# Patient Record
Sex: Female | Born: 1975
Health system: Southern US, Community
[De-identification: ages and names within clinical notes are randomized; demographics above are authoritative.]

## PROBLEM LIST (undated history)

## (undated) DIAGNOSIS — K219 Gastro-esophageal reflux disease without esophagitis: Secondary | ICD-10-CM

## (undated) DIAGNOSIS — Z9889 Other specified postprocedural states: Secondary | ICD-10-CM

## (undated) DIAGNOSIS — I10 Essential (primary) hypertension: Secondary | ICD-10-CM

## (undated) DIAGNOSIS — IMO0002 Reserved for concepts with insufficient information to code with codable children: Secondary | ICD-10-CM

## (undated) DIAGNOSIS — M1990 Unspecified osteoarthritis, unspecified site: Secondary | ICD-10-CM

## (undated) DIAGNOSIS — R87619 Unspecified abnormal cytological findings in specimens from cervix uteri: Secondary | ICD-10-CM

## (undated) HISTORY — PX: BREAST SURGERY: SHX581

## (undated) HISTORY — PX: DILATION AND CURETTAGE OF UTERUS: SHX78

## (undated) HISTORY — DX: Unspecified abnormal cytological findings in specimens from cervix uteri: R87.619

## (undated) HISTORY — DX: Reserved for concepts with insufficient information to code with codable children: IMO0002

---

## 1999-01-05 ENCOUNTER — Ambulatory Visit (HOSPITAL_COMMUNITY): Admission: RE | Admit: 1999-01-05 | Discharge: 1999-01-05 | Payer: Self-pay | Admitting: Obstetrics

## 1999-05-23 ENCOUNTER — Inpatient Hospital Stay (HOSPITAL_COMMUNITY): Admission: AD | Admit: 1999-05-23 | Discharge: 1999-05-23 | Payer: Self-pay | Admitting: *Deleted

## 1999-05-25 ENCOUNTER — Inpatient Hospital Stay (HOSPITAL_COMMUNITY): Admission: AD | Admit: 1999-05-25 | Discharge: 1999-05-25 | Payer: Self-pay | Admitting: Obstetrics & Gynecology

## 1999-05-29 ENCOUNTER — Inpatient Hospital Stay (HOSPITAL_COMMUNITY): Admission: AD | Admit: 1999-05-29 | Discharge: 1999-05-31 | Payer: Self-pay | Admitting: Obstetrics & Gynecology

## 2008-01-22 ENCOUNTER — Ambulatory Visit (HOSPITAL_COMMUNITY): Admission: RE | Admit: 2008-01-22 | Discharge: 2008-01-22 | Payer: Self-pay | Admitting: Obstetrics

## 2008-06-01 ENCOUNTER — Inpatient Hospital Stay (HOSPITAL_COMMUNITY): Admission: AD | Admit: 2008-06-01 | Discharge: 2008-06-03 | Payer: Self-pay | Admitting: Obstetrics

## 2010-09-17 NOTE — L&D Delivery Note (Signed)
Delivery Note At 5:20 AM a viable female was delivered via Vaginal, Spontaneous Delivery (Presentation: Right Occiput Anterior).  APGAR: 9, 8; weight 6+12 Placenta status: Intact, Spontaneous.  Cord: 3 vessels with the following complications: None.   Anesthesia: None  Episiotomy: None Lacerations: none Est. Blood Loss (mL): 250cc  Mom to postpartum.  Baby to nursery-stable.  Cam Hai 08/12/2011, 5:35 AM

## 2011-05-19 ENCOUNTER — Encounter (HOSPITAL_COMMUNITY): Payer: Self-pay

## 2011-05-19 ENCOUNTER — Inpatient Hospital Stay (HOSPITAL_COMMUNITY)
Admission: AD | Admit: 2011-05-19 | Discharge: 2011-05-20 | Disposition: A | Discharge status: Home / Self Care | Payer: BC Managed Care – PPO | Source: Ambulatory Visit | Attending: Obstetrics & Gynecology | Admitting: Obstetrics & Gynecology

## 2011-05-19 DIAGNOSIS — R1013 Epigastric pain: Secondary | ICD-10-CM | POA: Insufficient documentation

## 2011-05-19 DIAGNOSIS — O0930 Supervision of pregnancy with insufficient antenatal care, unspecified trimester: Secondary | ICD-10-CM

## 2011-05-19 DIAGNOSIS — O99891 Other specified diseases and conditions complicating pregnancy: Secondary | ICD-10-CM | POA: Insufficient documentation

## 2011-05-19 DIAGNOSIS — Z0371 Encounter for suspected problem with amniotic cavity and membrane ruled out: Secondary | ICD-10-CM | POA: Insufficient documentation

## 2011-05-19 LAB — URINALYSIS, ROUTINE W REFLEX MICROSCOPIC
Bilirubin Urine: NEGATIVE
Hgb urine dipstick: NEGATIVE
Specific Gravity, Urine: >1.03 — ABNORMAL HIGH (ref 1.005–1.030)
pH: 6 (ref 5.0–8.0)

## 2011-05-19 NOTE — Progress Notes (Signed)
Pt states, " Yesterday after my shower, I noticed a little water ran out. No more can out, but today I had some cramping in my abdomen and it made we concerned."

## 2011-05-19 NOTE — Progress Notes (Signed)
Patient is here with c/o upper abdominal cramping today. She states that she had an episode of clear vaginal discharge yesterday and none today. She states that she does not have prenatal care because no one wants to take care of her due not having [place] ID card. She states that she had adopt a mom in 2009 and was delivered by dr Gaynell Face. She reports good fetal movement and denies vaginal bleeding.

## 2011-05-20 ENCOUNTER — Inpatient Hospital Stay (HOSPITAL_COMMUNITY): Payer: BC Managed Care – PPO

## 2011-05-20 NOTE — ED Provider Notes (Signed)
Joyce Garcia is a 36 y.o. female at 43.[redacted] week gestation presenting for  questionable LOF x 1 yesterday after getting out of the shower, w/ no leaking to follow and wanting to "check on the baby" since she has not started [facility] due to not having ID. She also reports mild epigastric pain over the past day. She denies contractions, vaginal bleeding, N/V/D, fever or chills.  Maternal Medical History:  Reason for admission: Reason for admission: rupture of membranes.  Contractions: None   Fetal activity: Perceived fetal activity is normal.      OB History    Grav Para Term Preterm Abortions TAB SAB Ect Mult Living   7 3 3  3  3   3     Denies HX pregnancy complications History reviewed. No pertinent past medical history. Past Surgical History  Procedure Date  . Dilation and curettage of uterus     miscarriage  Hx abd Pap and colpo  Family History: family history is not on file. Social History:  reports that she has never smoked. She does not have any smokeless tobacco history on file. She reports that she does not drink alcohol or use illicit drugs.  Review of Systems  All other systems reviewed and are negative.      Blood pressure 118/78, pulse 82, temperature 98.5 F (36.9 C), temperature source Oral, resp. rate 18, height 5\' 2"  (1.575 m), weight 70.931 kg (156 lb 6 oz), last menstrual period 11/06/2010, SpO2 97.00%. Maternal Exam:  Uterine Assessment: None  Abdomen: Fundal height is S=D.    Introitus: Normal vulva. Vagina is negative for discharge.  Neg pooling  Cervix: Cervix evaluated by sterile speculum exam and digital exam.     Fetal Exam Fetal Monitor Review: Mode: ultrasound.   Baseline rate: 140-150's.  Variability: moderate (6-25 bpm).   Pattern: accelerations present and no decelerations.    Fetal State Assessment: Category I - tracings are normal.     Physical Exam  Constitutional: She is oriented to person, place, and time. She appears  well-developed and well-nourished. No distress.  HENT:  Head: Normocephalic.  Cardiovascular: Normal rate.   Respiratory: Effort normal.  GI: Soft. There is no tenderness.  Genitourinary: Vagina normal and uterus normal. There is no lesion on the right labia. There is no lesion on the left labia. Uterus is not tender. Cervix exhibits no discharge and no friability. No erythema or bleeding around the vagina. No vaginal discharge found.  Musculoskeletal: Normal range of motion.  Neurological: She is alert and oriented to person, place, and time.  Skin: Skin is warm and dry.  Psychiatric: She has a normal mood and affect.    Cervix: long, closed, -3 , posterior, mod consistency, no LOF  Results for orders placed during the hospital encounter of 05/19/11 (from the past 24 hour(s))  URINALYSIS, ROUTINE W REFLEX MICROSCOPIC     Status: Abnormal   Collection Time   05/19/11 10:00 PM      Component Value Range   Color, Urine YELLOW  YELLOW    Appearance CLEAR  CLEAR    Specific Gravity, Urine >1.030 (*) 1.005 - 1.030    pH 6.0  5.0 - 8.0    Glucose, UA NEGATIVE  NEGATIVE (mg/dL)   Hgb urine dipstick NEGATIVE  NEGATIVE    Bilirubin Urine NEGATIVE  NEGATIVE    Ketones, ur 15 (*) NEGATIVE (mg/dL)   Protein, ur NEGATIVE  NEGATIVE (mg/dL)   Urobilinogen, UA 1.0  0.0 - 1.0 (  mg/dL)   Nitrite NEGATIVE  NEGATIVE    Leukocytes, UA NEGATIVE  NEGATIVE    [place]: SIUP, S=D. Vtx, ant placenta above os. Normal anatomy, except spine not well-visualized. Normal fluid.  Prenatal labs: ABO, Rh:   Antibody:   Rubella:   RPR:    HBsAg:    HIV:    GBS:     Assessment/Plan: Assessment: 1. 27.5 Week IUP 2. No evidence of leaking of fluid 3. No PNC  Plan: 1. Per consult w/ Dr. Macon Large and Nolene Bernheim, GDHD NP, pt will be accepted for [facility]. If unable to get appt by 32-34 weeks, we will accept her at the [facility].  2. PTL precautions 3. F/U in [facility] PRN    SMITH, [place] 05/20/2011, 12:09 AM

## 2011-05-21 NOTE — ED Provider Notes (Signed)
Attestation of Attending Supervision of Advanced Practitioner: Evaluation and management procedures were performed by the PA/NP/CNM/OB Fellow under my supervision/collaboration. Chart reviewed and agree with management and plan.  Marialice Newkirk A 05/21/2011 3:24 PM

## 2011-05-22 LAB — GC/CHLAMYDIA PROBE AMP, URINE: Chlamydia, Swab/Urine, PCR: NEGATIVE

## 2011-06-18 LAB — CBC
HCT: 39.8
HCT: 44
Hemoglobin: 13.5
Hemoglobin: 14.9
MCHC: 33.9
MCV: 102 — ABNORMAL HIGH
RBC: 3.91
WBC: 7.7

## 2011-06-20 ENCOUNTER — Ambulatory Visit (INDEPENDENT_AMBULATORY_CARE_PROVIDER_SITE_OTHER): Payer: Self-pay | Admitting: Advanced Practice Midwife

## 2011-06-20 VITALS — BP 101/61 | Temp 97.8°F | Wt 159.1 lb

## 2011-06-20 DIAGNOSIS — Z3490 Encounter for supervision of normal pregnancy, unspecified, unspecified trimester: Secondary | ICD-10-CM

## 2011-06-20 DIAGNOSIS — Z3480 Encounter for supervision of other normal pregnancy, unspecified trimester: Secondary | ICD-10-CM

## 2011-06-20 LAB — HIV ANTIBODY (ROUTINE TESTING W REFLEX): HIV: NONREACTIVE

## 2011-06-20 LAB — POCT URINALYSIS DIP (DEVICE)
Hgb urine dipstick: NEGATIVE
Nitrite: NEGATIVE
Urobilinogen, UA: 0.2 mg/dL (ref 0.0–1.0)
pH: 7 (ref 5.0–8.0)

## 2011-06-20 NOTE — Progress Notes (Signed)
I supervised pt's care and agree with student's note.  

## 2011-06-20 NOTE — Progress Notes (Signed)
Addended by: Archie Patten on: 06/20/2011 10:02 AM   Modules accepted: Orders

## 2011-06-20 NOTE — Progress Notes (Signed)
Addended by: Joseph Berkshire on: 06/20/2011 10:10 AM   Modules accepted: Kipp Brood

## 2011-06-20 NOTE — Patient Instructions (Signed)
Pregnancy - Third Trimester The third trimester of pregnancy (the last 3 months) is a period of the most rapid growth for you and your baby. The baby approaches a length of 20 inches and a weight of 6 to 10 pounds. The baby is adding on fat and getting ready for life outside your body. While inside, babies have periods of sleeping and waking, suck their thumbs, and hiccups. You can often feel small contractions of the uterus. This is false labor. It is also called Braxton-Hicks contractions. This is like a practice for labor. The usual problems in this stage of pregnancy include more difficulty breathing, swelling of the hands and feet from water retention, and having to urinate more often because of the uterus and baby pressing on your bladder.  PRENATAL EXAMS  Blood work may continue to be done during prenatal exams. These tests are done to check on your health and the probable health of your baby. Blood work is used to follow your blood levels (hemoglobin). Anemia (low hemoglobin) is common during pregnancy. Iron and vitamins are given to help prevent this. You may also continue to be checked for diabetes. Some of the past blood tests may be done again.   The size of the uterus is measured during each visit. This makes sure your baby is growing properly according to your pregnancy dates.   Your blood pressure is checked every prenatal visit. This is to make sure you are not getting toxemia.   Your urine is checked every prenatal visit for infection, diabetes and protein.   Your weight is checked at each visit. This is done to make sure gains are happening at the suggested rate and that you and your baby are growing normally.   Sometimes, an ultrasound is performed to confirm the position and the proper growth and development of the baby. This is a test done that bounces harmless sound waves off the baby so your caregiver can more accurately determine due dates.   Discuss the type of pain  medication and anesthesia you will have during your labor and delivery.   Discuss the possibility and anesthesia if a Cesarean Section might be necessary.   Inform your caregiver if there is any mental or physical violence at home.  Sometimes, a specialized non-stress test, contraction stress test and biophysical profile are done to make sure the baby is not having a problem. Checking the amniotic fluid surrounding the baby is called an amniocentesis. The amniotic fluid is removed by sticking a needle into the belly (abdomen). This is sometimes done near the end of pregnancy if an early delivery is required. In this case, it is done to help make sure the baby's lungs are mature enough for the baby to live outside of the womb. If the lungs are not mature and it is unsafe to deliver the baby, an injection of cortisone medication is given to the mother 1 to 2 days before the delivery. This helps the baby's lungs mature and makes it safer to deliver the baby. CHANGES OCCURING IN THE THIRD TRIMESTER OF PREGNANCY Your body goes through many changes during pregnancy. They vary from person to person. Talk to your caregiver about changes you notice and are concerned about.  During the last trimester, you have probably had an increase in your appetite. It is normal to have cravings for certain foods. This varies from person to person and pregnancy to pregnancy.   You may begin to get stretch marks on your hips,   abdomen, and breasts. These are normal changes in the body during pregnancy. There are no exercises or medications to take which prevent this change.   Constipation may be treated with a stool softener or adding bulk to your diet. Drinking lots of fluids, fiber in vegetables, fruits, and whole grains are helpful.   Exercising is also helpful. If you have been very active up until your pregnancy, most of these activities can be continued during your pregnancy. If you have been less active, it is helpful  to start an exercise program such as walking. Consult your caregiver before starting exercise programs.   Avoid all smoking, alcohol, un-prescribed drugs, herbs and "street drugs" during your pregnancy. These chemicals affect the formation and growth of the baby. Avoid chemicals throughout the pregnancy to ensure the delivery of a healthy infant.   Backache, varicose veins and hemorrhoids may develop or get worse.   You will tire more easily in the third trimester, which is normal.   The baby's movements may be stronger and more often.   You may become short of breath easily.   Your belly button may stick out.   A yellow discharge may leak from your breasts called colostrum.   You may have a bloody mucus discharge. This usually occurs a few days to a week before labor begins.  HOME CARE INSTRUCTIONS  Keep your caregiver's appointments. Follow your caregiver's instructions regarding medication use, exercise, and diet.   During pregnancy, you are providing food for you and your baby. Continue to eat regular, well-balanced meals. Choose foods such as meat, fish, milk and other low fat dairy products, vegetables, fruits, and whole-grain breads and cereals. Your caregiver will tell you of the ideal weight gain.   A physical sexual relationship may be continued throughout pregnancy if there are no other problems such as early (premature) leaking of amniotic fluid from the membranes, vaginal bleeding, or belly (abdominal) pain.   Exercise regularly if there are no restrictions. Check with your caregiver if you are unsure of the safety of your exercises. Greater weight gain will occur in the last 2 trimesters of pregnancy. Exercising helps:   Control your weight.   Get you in shape for labor and delivery.   You lose weight after you deliver.   Rest a lot with legs elevated, or as needed for leg cramps or low back pain.   Wear a good support or jogging bra for breast tenderness during  pregnancy. This may help if worn during sleep. Pads or tissues may be used in the bra if you are leaking colostrum.   Do not use hot tubs, steam rooms, or saunas.   Wear your seat belt when driving. This protects you and your baby if you are in an accident.   Avoid raw meat, cat litter boxes and soil used by cats. These carry germs that can cause birth defects in the baby.   It is easier to loose urine during pregnancy. Tightening up and strengthening the pelvic muscles will help with this problem. You can practice stopping your urination while you are going to the bathroom. These are the same muscles you need to strengthen. It is also the muscles you would use if you were trying to stop from passing gas. You can practice tightening these muscles up 10 times a set and repeating this about 3 times per day. Once you know what muscles to tighten up, do not perform these exercises during urination. It is more likely to   cause an infection by backing up the urine.   Ask for help if you have financial, counseling or nutritional needs during pregnancy. Your caregiver will be able to offer counseling for these needs as well as refer you for other special needs.   Make a list of emergency phone numbers and have them available.   Plan on getting help from family or friends when you go home from the hospital.   Make a trial run to the hospital.   Take prenatal classes with the father to understand, practice and ask questions about the labor and delivery.   Prepare the baby's room/nursery.   Do not travel out of the city unless it is absolutely necessary and with the advice of your caregiver.   Wear only low or no heal shoes to have better balance and prevent falling.  MEDICATIONS AND DRUG USE IN PREGNANCY  Take prenatal vitamins as directed. The vitamin should contain 1 milligram of folic acid. Keep all vitamins out of reach of children. Only a couple vitamins or tablets containing iron may be fatal  to a baby or young child when ingested.   Avoid use of all medications, including herbs, over-the-counter medications, not prescribed or suggested by your caregiver. Only take over-the-counter or prescription medicines for pain, discomfort, or fever as directed by your caregiver. Do not use aspirin, ibuprofen (Motrin, Advil, Nuprin) or naproxen (Aleve) unless OK'd by your caregiver.   Let your caregiver also know about herbs you may be using.   Alcohol is related to a number of birth defects. This includes fetal alcohol syndrome. All alcohol, in any form, should be avoided completely. Smoking will cause low birth rate and premature babies.   Street/illegal drugs are very harmful to the baby. They are absolutely forbidden. A baby born to an addicted mother will be addicted at birth. The baby will go through the same withdrawal an adult does.  SEEK MEDICAL CARE IF  You have any concerns or worries during your pregnancy. It is better to call with your questions if you feel they cannot wait, rather than worry about them.  DECISIONS ABOUT CIRCUMCISION You may or may not know the sex of your baby. If you know your baby is a boy, it may be time to think about circumcision. Circumcision is the removal of the foreskin of the penis. This is the skin that covers the sensitive end of the penis. There is no proven medical need for this. Often this decision is made on what is popular at the time or based upon religious beliefs and social issues. You can discuss these issues with your caregiver or pediatrician. SEEK IMMEDIATE MEDICAL CARE IF:  An unexplained oral temperature above 100.5 develops, or as your caregiver suggests.   You have leaking of fluid from the vagina (birth canal). If leaking membranes are suspected, take your temperature and tell your caregiver of this when you call.   There is vaginal spotting, bleeding or passing clots. Tell your caregiver of the amount and how many pads are used.    You develop a bad smelling vaginal discharge with a change in the color from clear to white.   You develop vomiting that lasts more than 24 hours.   You develop chills or fever.   You develop shortness of breath.   You develop burning on urination.   You loose more than 2 pounds of weight or gain more than 2 pounds of weight or as suggested by your caregiver.     You notice sudden swelling of your face, hands, and feet or legs.   You develop belly (abdominal) pain. Round ligament discomfort is a common non-cancerous (benign) cause of abdominal pain in pregnancy. Your caregiver still must evaluate you.   You develop a severe headache that does not go away.   You develop visual problems, blurred or double vision.   If you have not felt your baby move for more than 1 hour. If you think the baby is not moving as much as usual, eat something with sugar in it and lie down on your left side for an hour. The baby should move at least 4 to 5 times per hour. Call right away if your baby moves less than that.   You fall, are in a car accident or any kind of trauma.   There is mental or physical violence at home.  Document Released: 08/28/2001 Document Re-Released: 07/01/2009 Exit[employer] Patient Information 2011 [employer]. 

## 2011-06-20 NOTE — Progress Notes (Signed)
Some swelling in her feet, has pelvic pressure but no pain. 1 hr gtt today due at 1012, also needs all initial labs and hiv, hgb electro. Pt offered flu vaccine but declines, pt signed form declining flu vaccine.

## 2011-06-20 NOTE — Progress Notes (Signed)
   Subjective:    Joyce Garcia is a [id number] [redacted]w[redacted]d being seen today for her first obstetrical visit. Pregnancy history fully reviewed.  Patient reports backache, no bleeding, no contractions, no leaking and round ligament pain.  Filed Vitals:   06/20/11 0856  BP: 101/61  Temp: 97.8 F (36.6 C)  Weight: 72.167 kg (159 lb 1.6 oz)    HISTORY: OB History    Grav Para Term Preterm Abortions TAB SAB Ect Mult Living   7 3 3  3  3   3      # Outc Date GA Lbr Len/2nd Wgt Sex Del Anes PTL Lv   1 TRM 2/98    M SVD   Yes   2 TRM 9/00    M SVD   Yes   3 TRM 9/09    F SVD   Yes   4 SAB 2/11           5 SAB            6 SAB            7 CUR              Past Medical History  Diagnosis Date  . Abnormal Pap smear     at HD prior to becoming pregnant, unsure of what was abnormal   Past Surgical History  Procedure Date  . Dilation and curettage of uterus     miscarriage  . Breast surgery     had lump removed   Family History  Problem Relation Age of Onset  . Hypertension Father   . Hypertension Sister   . Hypertension Sister   . Hypertension Sister      Exam    Uterine Size: 31.5 cm  Pelvic Exam:    Perineum: Not assessed   Vulva: Not assessed   Vagina:  Exam deferred until next visit   pH:    Cervix: exam deferred   Adnexa: exam deferred   Bony Pelvis: exam deferred  System: Breast:  normal appearance, no masses or tenderness, not assessed   Skin: normal coloration and turgor, no rashes    Neurologic: oriented, normal, normal mood   Extremities: no musculoskeletal defects noted, joint mobility appears intact   HEENT PERRLA   Mouth/Teeth mucous membranes moist, pharynx normal without lesions   Neck supple   Cardiovascular: regular rate and rhythm, no murmurs or gallops   Respiratory:  appears well, vitals normal, no respiratory distress, acyanotic, normal RR, ear and throat exam is normal, neck free of mass or lymphadenopathy, chest clear, no wheezing,  crepitations, rhonchi, normal symmetric air entry   Abdomen: soft, non-tender; bowel sounds normal; no masses,  no organomegaly   Urinary: not assessed      Assessment:    Pregnancy: [id number] There is no problem list on file for this patient. Normal pregnancy; late prenatal care.       Plan:     Initial labs drawn. Prenatal vitamins. Problem list reviewed and updated. Ultrasound discussed; fetal survey: ordered. Patient to complete ROI for Pap smear results from [facility]. Pap smear at next visit if needed, depending on results and dates from previous pap smear.  Follow up in 2 weeks.    Judith Blonder 06/20/2011

## 2011-06-20 NOTE — Progress Notes (Signed)
Addended by: Archie Patten on: 06/20/2011 10:12 AM   Modules accepted: Orders

## 2011-06-21 LAB — OBSTETRIC PANEL
Antibody Screen: NEGATIVE
Basophils Relative: 0 % (ref 0–1)
HCT: 35.8 % — ABNORMAL LOW (ref 36.0–46.0)
Hemoglobin: 12 g/dL (ref 12.0–15.0)
Lymphocytes Relative: 23 % (ref 12–46)
Lymphs Abs: 1.9 10*3/uL (ref 0.7–4.0)
MCHC: 33.5 g/dL (ref 30.0–36.0)
Monocytes Absolute: 0.5 10*3/uL (ref 0.1–1.0)
Monocytes Relative: 7 % (ref 3–12)
Neutro Abs: 5.7 10*3/uL (ref 1.7–7.7)
Rh Type: POSITIVE
Rubella: 247.1 IU/mL — ABNORMAL HIGH

## 2011-06-21 LAB — DRUGS OF ABUSE SCREEN W/O ALC, ROUTINE URINE
Barbiturate Quant, Ur: NEGATIVE
Cocaine Metabolites: NEGATIVE
Creatinine,U: 104.5 mg/dL
Opiate Screen, Urine: NEGATIVE

## 2011-06-25 ENCOUNTER — Ambulatory Visit (HOSPITAL_COMMUNITY)
Admission: RE | Admit: 2011-06-25 | Discharge: 2011-06-25 | Disposition: A | Discharge status: Home / Self Care | Payer: Self-pay | Source: Ambulatory Visit | Attending: Advanced Practice Midwife | Admitting: Advanced Practice Midwife

## 2011-06-25 DIAGNOSIS — O2620 Pregnancy care for patient with recurrent pregnancy loss, unspecified trimester: Secondary | ICD-10-CM | POA: Insufficient documentation

## 2011-06-25 DIAGNOSIS — Z3490 Encounter for supervision of normal pregnancy, unspecified, unspecified trimester: Secondary | ICD-10-CM

## 2011-06-25 DIAGNOSIS — Z3689 Encounter for other specified antenatal screening: Secondary | ICD-10-CM | POA: Insufficient documentation

## 2011-07-04 ENCOUNTER — Ambulatory Visit (INDEPENDENT_AMBULATORY_CARE_PROVIDER_SITE_OTHER): Payer: Self-pay | Admitting: Obstetrics and Gynecology

## 2011-07-04 DIAGNOSIS — Z3480 Encounter for supervision of other normal pregnancy, unspecified trimester: Secondary | ICD-10-CM

## 2011-07-04 DIAGNOSIS — O09529 Supervision of elderly multigravida, unspecified trimester: Secondary | ICD-10-CM

## 2011-07-04 LAB — POCT URINALYSIS DIP (DEVICE)
Bilirubin Urine: NEGATIVE
Glucose, UA: NEGATIVE mg/dL
Hgb urine dipstick: NEGATIVE
Ketones, ur: NEGATIVE mg/dL
Specific Gravity, Urine: 1.02 (ref 1.005–1.030)

## 2011-07-04 NOTE — Progress Notes (Signed)
Edema- feet. Pain- lower back. No vaginal discharge. Pulse-94.  Pt declines flu vaccine

## 2011-07-04 NOTE — Progress Notes (Signed)
S: LBP esp with walking. Concerned about Pap because had "treatment" for abnl last year. Undecided about contraceptive method.  O: [place] 10/8 all nl. 65th%ile. All PN labs reviewed and nl. Per [facility] Pap nl on 09/2010, GC/CT neg. Back: neg CVAT. Sl TTP paraspinous area L-S spine Abd: soft, NT, large diastasis  A/P: M-S LBP due to multiparity and poor abd muscle tone. Reviewed contraceptive options and plan to breast and botttle feed. Reviewed all her lab results.

## 2011-07-04 NOTE — Patient Instructions (Signed)
Pregnancy - Third Trimester The third trimester of pregnancy (the last 3 months) is a period of the most rapid growth for you and your baby. The baby approaches a length of 20 inches and a weight of 6 to 10 pounds. The baby is adding on fat and getting ready for life outside your body. While inside, babies have periods of sleeping and waking, suck their thumbs, and hiccups. You can often feel small contractions of the uterus. This is false labor. It is also called Braxton-Hicks contractions. This is like a practice for labor. The usual problems in this stage of pregnancy include more difficulty breathing, swelling of the hands and feet from water retention, and having to urinate more often because of the uterus and baby pressing on your bladder.  PRENATAL EXAMS  Blood work may continue to be done during prenatal exams. These tests are done to check on your health and the probable health of your baby. Blood work is used to follow your blood levels (hemoglobin). Anemia (low hemoglobin) is common during pregnancy. Iron and vitamins are given to help prevent this. You may also continue to be checked for diabetes. Some of the past blood tests may be done again.   The size of the uterus is measured during each visit. This makes sure your baby is growing properly according to your pregnancy dates.   Your blood pressure is checked every prenatal visit. This is to make sure you are not getting toxemia.   Your urine is checked every prenatal visit for infection, diabetes and protein.   Your weight is checked at each visit. This is done to make sure gains are happening at the suggested rate and that you and your baby are growing normally.   Sometimes, an ultrasound is performed to confirm the position and the proper growth and development of the baby. This is a test done that bounces harmless sound waves off the baby so your caregiver can more accurately determine due dates.   Discuss the type of pain  medication and anesthesia you will have during your labor and delivery.   Discuss the possibility and anesthesia if a Cesarean Section might be necessary.   Inform your caregiver if there is any mental or physical violence at home.  Sometimes, a specialized non-stress test, contraction stress test and biophysical profile are done to make sure the baby is not having a problem. Checking the amniotic fluid surrounding the baby is called an amniocentesis. The amniotic fluid is removed by sticking a needle into the belly (abdomen). This is sometimes done near the end of pregnancy if an early delivery is required. In this case, it is done to help make sure the baby's lungs are mature enough for the baby to live outside of the womb. If the lungs are not mature and it is unsafe to deliver the baby, an injection of cortisone medication is given to the mother 1 to 2 days before the delivery. This helps the baby's lungs mature and makes it safer to deliver the baby. CHANGES OCCURING IN THE THIRD TRIMESTER OF PREGNANCY Your body goes through many changes during pregnancy. They vary from person to person. Talk to your caregiver about changes you notice and are concerned about.  During the last trimester, you have probably had an increase in your appetite. It is normal to have cravings for certain foods. This varies from person to person and pregnancy to pregnancy.   You may begin to get stretch marks on your hips,   abdomen, and breasts. These are normal changes in the body during pregnancy. There are no exercises or medications to take which prevent this change.   Constipation may be treated with a stool softener or adding bulk to your diet. Drinking lots of fluids, fiber in vegetables, fruits, and whole grains are helpful.   Exercising is also helpful. If you have been very active up until your pregnancy, most of these activities can be continued during your pregnancy. If you have been less active, it is helpful  to start an exercise program such as walking. Consult your caregiver before starting exercise programs.   Avoid all smoking, alcohol, un-prescribed drugs, herbs and "street drugs" during your pregnancy. These chemicals affect the formation and growth of the baby. Avoid chemicals throughout the pregnancy to ensure the delivery of a healthy infant.   Backache, varicose veins and hemorrhoids may develop or get worse.   You will tire more easily in the third trimester, which is normal.   The baby's movements may be stronger and more often.   You may become short of breath easily.   Your belly button may stick out.   A yellow discharge may leak from your breasts called colostrum.   You may have a bloody mucus discharge. This usually occurs a few days to a week before labor begins.  HOME CARE INSTRUCTIONS  Keep your caregiver's appointments. Follow your caregiver's instructions regarding medication use, exercise, and diet.   During pregnancy, you are providing food for you and your baby. Continue to eat regular, well-balanced meals. Choose foods such as meat, fish, milk and other low fat dairy products, vegetables, fruits, and whole-grain breads and cereals. Your caregiver will tell you of the ideal weight gain.   A physical sexual relationship may be continued throughout pregnancy if there are no other problems such as early (premature) leaking of amniotic fluid from the membranes, vaginal bleeding, or belly (abdominal) pain.   Exercise regularly if there are no restrictions. Check with your caregiver if you are unsure of the safety of your exercises. Greater weight gain will occur in the last 2 trimesters of pregnancy. Exercising helps:   Control your weight.   Get you in shape for labor and delivery.   You lose weight after you deliver.   Rest a lot with legs elevated, or as needed for leg cramps or low back pain.   Wear a good support or jogging bra for breast tenderness during  pregnancy. This may help if worn during sleep. Pads or tissues may be used in the bra if you are leaking colostrum.   Do not use hot tubs, steam rooms, or saunas.   Wear your seat belt when driving. This protects you and your baby if you are in an accident.   Avoid raw meat, cat litter boxes and soil used by cats. These carry germs that can cause birth defects in the baby.   It is easier to loose urine during pregnancy. Tightening up and strengthening the pelvic muscles will help with this problem. You can practice stopping your urination while you are going to the bathroom. These are the same muscles you need to strengthen. It is also the muscles you would use if you were trying to stop from passing gas. You can practice tightening these muscles up 10 times a set and repeating this about 3 times per day. Once you know what muscles to tighten up, do not perform these exercises during urination. It is more likely to   cause an infection by backing up the urine.   Ask for help if you have financial, counseling or nutritional needs during pregnancy. Your caregiver will be able to offer counseling for these needs as well as refer you for other special needs.   Make a list of emergency phone numbers and have them available.   Plan on getting help from family or friends when you go home from the hospital.   Make a trial run to the hospital.   Take prenatal classes with the father to understand, practice and ask questions about the labor and delivery.   Prepare the baby's room/nursery.   Do not travel out of the city unless it is absolutely necessary and with the advice of your caregiver.   Wear only low or no heal shoes to have better balance and prevent falling.  MEDICATIONS AND DRUG USE IN PREGNANCY  Take prenatal vitamins as directed. The vitamin should contain 1 milligram of folic acid. Keep all vitamins out of reach of children. Only a couple vitamins or tablets containing iron may be fatal  to a baby or young child when ingested.   Avoid use of all medications, including herbs, over-the-counter medications, not prescribed or suggested by your caregiver. Only take over-the-counter or prescription medicines for pain, discomfort, or fever as directed by your caregiver. Do not use aspirin, ibuprofen (Motrin, Advil, Nuprin) or naproxen (Aleve) unless OK'd by your caregiver.   Let your caregiver also know about herbs you may be using.   Alcohol is related to a number of birth defects. This includes fetal alcohol syndrome. All alcohol, in any form, should be avoided completely. Smoking will cause low birth rate and premature babies.   Street/illegal drugs are very harmful to the baby. They are absolutely forbidden. A baby born to an addicted mother will be addicted at birth. The baby will go through the same withdrawal an adult does.  SEEK MEDICAL CARE IF  You have any concerns or worries during your pregnancy. It is better to call with your questions if you feel they cannot wait, rather than worry about them.  DECISIONS ABOUT CIRCUMCISION You may or may not know the sex of your baby. If you know your baby is a boy, it may be time to think about circumcision. Circumcision is the removal of the foreskin of the penis. This is the skin that covers the sensitive end of the penis. There is no proven medical need for this. Often this decision is made on what is popular at the time or based upon religious beliefs and social issues. You can discuss these issues with your caregiver or pediatrician. SEEK IMMEDIATE MEDICAL CARE IF:  An unexplained oral temperature above 100 develops, or as your caregiver suggests.   You have leaking of fluid from the vagina (birth canal). If leaking membranes are suspected, take your temperature and tell your caregiver of this when you call.   There is vaginal spotting, bleeding or passing clots. Tell your caregiver of the amount and how many pads are used.    You develop a bad smelling vaginal discharge with a change in the color from clear to white.   You develop vomiting that lasts more than 24 hours.   You develop chills or fever.   You develop shortness of breath.   You develop burning on urination.   You loose more than 2 pounds of weight or gain more than 2 pounds of weight or as suggested by your caregiver.     You notice sudden swelling of your face, hands, and feet or legs.   You develop belly (abdominal) pain. Round ligament discomfort is a common non-cancerous (benign) cause of abdominal pain in pregnancy. Your caregiver still must evaluate you.   You develop a severe headache that does not go away.   You develop visual problems, blurred or double vision.   If you have not felt your baby move for more than 1 hour. If you think the baby is not moving as much as usual, eat something with sugar in it and lie down on your left side for an hour. The baby should move at least 4 to 5 times per hour. Call right away if your baby moves less than that.   You fall, are in a car accident or any kind of trauma.   There is mental or physical violence at home.  Document Released: 08/28/2001 Document Re-Released: 07/01/2009 [employer] Patient Information 2011 [employer]. 

## 2011-07-18 ENCOUNTER — Ambulatory Visit (INDEPENDENT_AMBULATORY_CARE_PROVIDER_SITE_OTHER): Payer: Self-pay | Admitting: Family Medicine

## 2011-07-18 VITALS — BP 137/74 | Temp 97.9°F | Wt 163.4 lb

## 2011-07-18 DIAGNOSIS — IMO0002 Reserved for concepts with insufficient information to code with codable children: Secondary | ICD-10-CM

## 2011-07-18 DIAGNOSIS — Z23 Encounter for immunization: Secondary | ICD-10-CM

## 2011-07-18 DIAGNOSIS — Z3480 Encounter for supervision of other normal pregnancy, unspecified trimester: Secondary | ICD-10-CM

## 2011-07-18 DIAGNOSIS — O09529 Supervision of elderly multigravida, unspecified trimester: Secondary | ICD-10-CM

## 2011-07-18 LAB — POCT URINALYSIS DIP (DEVICE)
Bilirubin Urine: NEGATIVE
Ketones, ur: NEGATIVE mg/dL
Leukocytes, UA: NEGATIVE
Nitrite: NEGATIVE

## 2011-07-18 MED ORDER — TETANUS-DIPHTH-ACELL PERTUSSIS 5-2.5-18.5 LF-MCG/0.5 IM SUSP
0.5000 mL | Freq: Once | INTRAMUSCULAR | Status: AC
  Administered 2011-07-18: 0.5 mL via INTRAMUSCULAR

## 2011-07-18 NOTE — Patient Instructions (Signed)
Normal Labor and Delivery °Your caregiver must first be sure you are in labor. Signs of labor include: °· You may pass what is called "the mucus plug" before labor begins. This is a small amount of blood stained mucus.  °· Regular uterine contractions.  °· The time between contractions get closer together.  °· The discomfort and pain gradually gets more intense.  °· Pains are mostly located in the back.  °· Pains get worse when walking.  °· The cervix (the opening of the uterus becomes thinner (begins to efface) and opens up (dilates).  °Once you are in labor and admitted into the hospital or care center, your caregiver will do the following: °· A complete physical examination.  °· Check your vital signs (blood pressure, pulse, temperature and the fetal heart rate).  °· Do a vaginal examination (using a sterile glove and lubricant) to determine:  °· The position (presentation) of the baby (head [vertex] or buttock first).  °· The level (station) of the baby's head in the birth canal.  °· The effacement and dilatation of the cervix.  °· You may have your pubic hair shaved and be given an enema depending on your caregiver and the circumstance.  °· An electronic monitor is usually placed on your abdomen. The monitor follows the length and intensity of the contractions, as well as the baby's heart rate.  °· Usually, your caregiver will insert an IV in your arm with a bottle of sugar water. This is done as a precaution so that medications can be given to you quickly during labor or delivery.  °NORMAL LABOR AND DELIVERY IS DIVIDED UP INTO 3 STAGES: °First Stage °This is when regular contractions begin and the cervix begins to efface and dilate. This stage can last from 3 to 15 hours. The end of the first stage is when the cervix is 100% effaced and 10 centimeters dilated. Pain medications may be given by  °· Injection (morphine, demerol, etc.)  °· Regional anesthesia (spinal, caudal or epidural, anesthetics given in  different locations of the spine). Paracervical pain medication may be given, which is an injection of and anesthetic on each side of the cervix.  °A pregnant woman may request to have "Natural Childbirth" which is not to have any medications or anesthesia during her labor and delivery. °Second Stage °This is when the baby comes down through the birth canal (vagina) and is born. This can take 1 to 4 hours. As the baby's head comes down through the birth canal, you may feel like you are going to have a bowel movement. You will get the urge to bear down and push until the baby is delivered. As the baby's head is being delivered, the caregiver will decide if an episiotomy (a cut in the perineum and vagina area) is needed to prevent tearing of the tissue in this area. The episiotomy is sewn up after the delivery of the baby and placenta. Sometimes a mask with nitrous oxide is given for the mother to breath during the delivery of the baby to help if there is too much pain. The end of Stage 2 is when the baby is fully delivered. Then when the umbilical cord stops pulsating it is clamped and cut. °Third Stage °The third stage begins after the baby is completely delivered and ends after the placenta (afterbirth) is delivered. This usually takes 5 to 30 minutes. After the placenta is delivered, a medication is given either by intravenous or injection to help contract   the uterus and prevent bleeding. The third stage is not painful and pain medication is usually not necessary. If an episiotomy was done, it is repaired at this time. °After the delivery, the mother is watched and monitored closely for 1 to 2 hours to make sure there is no postpartum bleeding (hemorrhage). If there is a lot of bleeding, medication is given to contract the uterus and stop the bleeding. °Document Released: 06/12/2008 Document Revised: 05/16/2011 Document Reviewed: 06/12/2008 °[employer]® Patient Information ©2012 [employer]. °

## 2011-07-18 NOTE — Progress Notes (Signed)
Subjective:    Joyce Garcia is a 35 y.o. female being seen today for her obstetrical visit. She is at [redacted]w[redacted]d gestation. Patient reports no bleeding, no leaking and occasional contractions. Fetal movement: normal.  Menstrual History: OB History    Grav Para Term Preterm Abortions TAB SAB Ect Mult Living   7 3 3  3  3   3       Objective:    BP 137/74  Temp 97.9 F (36.6 C)  Wt 163 lb 6.4 oz (74.118 kg)  LMP 11/06/2010 FHT: 160 BPM  Uterine Size: 37 cm  Presentations: cephalic  Pelvic Exam:              Dilation: 0.5       Effacement: Long             Station:  -3    Consistency: medium            Position: posterior     Assessment:    Pregnancy 36 and 2/7 weeks  Advanced Maternal Age  Plan:   Plans for delivery: Vaginal anticipated Beta strep culture: done Counseling: Pediatrician discussed. Infant feeding: plans to breastfeed, plans to bottle feed. Discussed birth control options, not sure which she will chose. Follow up in 1 Week.

## 2011-07-18 NOTE — Progress Notes (Signed)
Pelvic pressure and irregular contractions. Pt would like tdap

## 2011-07-19 LAB — GC/CHLAMYDIA PROBE AMP, URINE
Chlamydia, Swab/Urine, PCR: NEGATIVE
GC Probe Amp, Urine: NEGATIVE

## 2011-07-21 LAB — CULTURE, BETA STREP (GROUP B ONLY)

## 2011-07-25 ENCOUNTER — Ambulatory Visit (INDEPENDENT_AMBULATORY_CARE_PROVIDER_SITE_OTHER): Payer: Self-pay | Admitting: Family Medicine

## 2011-07-25 ENCOUNTER — Other Ambulatory Visit: Payer: Self-pay

## 2011-07-25 DIAGNOSIS — Z3480 Encounter for supervision of other normal pregnancy, unspecified trimester: Secondary | ICD-10-CM

## 2011-07-25 DIAGNOSIS — O09529 Supervision of elderly multigravida, unspecified trimester: Secondary | ICD-10-CM

## 2011-07-25 DIAGNOSIS — IMO0002 Reserved for concepts with insufficient information to code with codable children: Secondary | ICD-10-CM

## 2011-07-25 NOTE — Progress Notes (Signed)
Edema-feet.  Pain/pressure-vaginal. Pulse- 90

## 2011-07-25 NOTE — Progress Notes (Signed)
No complaints or concerns.  Good fetal movement.  Denies vaginal bleeding, vaginal discharge.  Irregular contractions.

## 2011-07-25 NOTE — Patient Instructions (Signed)
Pregnancy - Third Trimester The third trimester of pregnancy (the last 3 months) is a period of the most rapid growth for you and your baby. The baby approaches a length of 20 inches and a weight of 6 to 10 pounds. The baby is adding on fat and getting ready for life outside your body. While inside, babies have periods of sleeping and waking, suck their thumbs, and hiccups. You can often feel small contractions of the uterus. This is false labor. It is also called Braxton-Hicks contractions. This is like a practice for labor. The usual problems in this stage of pregnancy include more difficulty breathing, swelling of the hands and feet from water retention, and having to urinate more often because of the uterus and baby pressing on your bladder.  PRENATAL EXAMS  Blood work may continue to be done during prenatal exams. These tests are done to check on your health and the probable health of your baby. Blood work is used to follow your blood levels (hemoglobin). Anemia (low hemoglobin) is common during pregnancy. Iron and vitamins are given to help prevent this. You may also continue to be checked for diabetes. Some of the past blood tests may be done again.   The size of the uterus is measured during each visit. This makes sure your baby is growing properly according to your pregnancy dates.   Your blood pressure is checked every prenatal visit. This is to make sure you are not getting toxemia.   Your urine is checked every prenatal visit for infection, diabetes and protein.   Your weight is checked at each visit. This is done to make sure gains are happening at the suggested rate and that you and your baby are growing normally.   Sometimes, an ultrasound is performed to confirm the position and the proper growth and development of the baby. This is a test done that bounces harmless sound waves off the baby so your caregiver can more accurately determine due dates.   Discuss the type of pain  medication and anesthesia you will have during your labor and delivery.   Discuss the possibility and anesthesia if a Cesarean Section might be necessary.   Inform your caregiver if there is any mental or physical violence at home.  Sometimes, a specialized non-stress test, contraction stress test and biophysical profile are done to make sure the baby is not having a problem. Checking the amniotic fluid surrounding the baby is called an amniocentesis. The amniotic fluid is removed by sticking a needle into the belly (abdomen). This is sometimes done near the end of pregnancy if an early delivery is required. In this case, it is done to help make sure the baby's lungs are mature enough for the baby to live outside of the womb. If the lungs are not mature and it is unsafe to deliver the baby, an injection of cortisone medication is given to the mother 1 to 2 days before the delivery. This helps the baby's lungs mature and makes it safer to deliver the baby. CHANGES OCCURING IN THE THIRD TRIMESTER OF PREGNANCY Your body goes through many changes during pregnancy. They vary from person to person. Talk to your caregiver about changes you notice and are concerned about.  During the last trimester, you have probably had an increase in your appetite. It is normal to have cravings for certain foods. This varies from person to person and pregnancy to pregnancy.   You may begin to get stretch marks on your hips,   abdomen, and breasts. These are normal changes in the body during pregnancy. There are no exercises or medications to take which prevent this change.   Constipation may be treated with a stool softener or adding bulk to your diet. Drinking lots of fluids, fiber in vegetables, fruits, and whole grains are helpful.   Exercising is also helpful. If you have been very active up until your pregnancy, most of these activities can be continued during your pregnancy. If you have been less active, it is helpful  to start an exercise program such as walking. Consult your caregiver before starting exercise programs.   Avoid all smoking, alcohol, un-prescribed drugs, herbs and "street drugs" during your pregnancy. These chemicals affect the formation and growth of the baby. Avoid chemicals throughout the pregnancy to ensure the delivery of a healthy infant.   Backache, varicose veins and hemorrhoids may develop or get worse.   You will tire more easily in the third trimester, which is normal.   The baby's movements may be stronger and more often.   You may become short of breath easily.   Your belly button may stick out.   A yellow discharge may leak from your breasts called colostrum.   You may have a bloody mucus discharge. This usually occurs a few days to a week before labor begins.  HOME CARE INSTRUCTIONS   Keep your caregiver's appointments. Follow your caregiver's instructions regarding medication use, exercise, and diet.   During pregnancy, you are providing food for you and your baby. Continue to eat regular, well-balanced meals. Choose foods such as meat, fish, milk and other low fat dairy products, vegetables, fruits, and whole-grain breads and cereals. Your caregiver will tell you of the ideal weight gain.   A physical sexual relationship may be continued throughout pregnancy if there are no other problems such as early (premature) leaking of amniotic fluid from the membranes, vaginal bleeding, or belly (abdominal) pain.   Exercise regularly if there are no restrictions. Check with your caregiver if you are unsure of the safety of your exercises. Greater weight gain will occur in the last 2 trimesters of pregnancy. Exercising helps:   Control your weight.   Get you in shape for labor and delivery.   You lose weight after you deliver.   Rest a lot with legs elevated, or as needed for leg cramps or low back pain.   Wear a good support or jogging bra for breast tenderness during  pregnancy. This may help if worn during sleep. Pads or tissues may be used in the bra if you are leaking colostrum.   Do not use hot tubs, steam rooms, or saunas.   Wear your seat belt when driving. This protects you and your baby if you are in an accident.   Avoid raw meat, cat litter boxes and soil used by cats. These carry germs that can cause birth defects in the baby.   It is easier to loose urine during pregnancy. Tightening up and strengthening the pelvic muscles will help with this problem. You can practice stopping your urination while you are going to the bathroom. These are the same muscles you need to strengthen. It is also the muscles you would use if you were trying to stop from passing gas. You can practice tightening these muscles up 10 times a set and repeating this about 3 times per day. Once you know what muscles to tighten up, do not perform these exercises during urination. It is more likely   to cause an infection by backing up the urine.   Ask for help if you have financial, counseling or nutritional needs during pregnancy. Your caregiver will be able to offer counseling for these needs as well as refer you for other special needs.   Make a list of emergency phone numbers and have them available.   Plan on getting help from family or friends when you go home from the hospital.   Make a trial run to the hospital.   Take prenatal classes with the father to understand, practice and ask questions about the labor and delivery.   Prepare the baby's room/nursery.   Do not travel out of the city unless it is absolutely necessary and with the advice of your caregiver.   Wear only low or no heal shoes to have better balance and prevent falling.  MEDICATIONS AND DRUG USE IN PREGNANCY  Take prenatal vitamins as directed. The vitamin should contain 1 milligram of folic acid. Keep all vitamins out of reach of children. Only a couple vitamins or tablets containing iron may be fatal  to a baby or young child when ingested.   Avoid use of all medications, including herbs, over-the-counter medications, not prescribed or suggested by your caregiver. Only take over-the-counter or prescription medicines for pain, discomfort, or fever as directed by your caregiver. Do not use aspirin, ibuprofen (Motrin, Advil, Nuprin) or naproxen (Aleve) unless OK'd by your caregiver.   Let your caregiver also know about herbs you may be using.   Alcohol is related to a number of birth defects. This includes fetal alcohol syndrome. All alcohol, in any form, should be avoided completely. Smoking will cause low birth rate and premature babies.   Street/illegal drugs are very harmful to the baby. They are absolutely forbidden. A baby born to an addicted mother will be addicted at birth. The baby will go through the same withdrawal an adult does.  SEEK MEDICAL CARE IF: You have any concerns or worries during your pregnancy. It is better to call with your questions if you feel they cannot wait, rather than worry about them. DECISIONS ABOUT CIRCUMCISION You may or may not know the sex of your baby. If you know your baby is a boy, it may be time to think about circumcision. Circumcision is the removal of the foreskin of the penis. This is the skin that covers the sensitive end of the penis. There is no proven medical need for this. Often this decision is made on what is popular at the time or based upon religious beliefs and social issues. You can discuss these issues with your caregiver or pediatrician. SEEK IMMEDIATE MEDICAL CARE IF:   An unexplained oral temperature above 102 F (38.9 C) develops, or as your caregiver suggests.   You have leaking of fluid from the vagina (birth canal). If leaking membranes are suspected, take your temperature and tell your caregiver of this when you call.   There is vaginal spotting, bleeding or passing clots. Tell your caregiver of the amount and how many pads are  used.   You develop a bad smelling vaginal discharge with a change in the color from clear to white.   You develop vomiting that lasts more than 24 hours.   You develop chills or fever.   You develop shortness of breath.   You develop burning on urination.   You loose more than 2 pounds of weight or gain more than 2 pounds of weight or as suggested by your   caregiver.   You notice sudden swelling of your face, hands, and feet or legs.   You develop belly (abdominal) pain. Round ligament discomfort is a common non-cancerous (benign) cause of abdominal pain in pregnancy. Your caregiver still must evaluate you.   You develop a severe headache that does not go away.   You develop visual problems, blurred or double vision.   If you have not felt your baby move for more than 1 hour. If you think the baby is not moving as much as usual, eat something with sugar in it and lie down on your left side for an hour. The baby should move at least 4 to 5 times per hour. Call right away if your baby moves less than that.   You fall, are in a car accident or any kind of trauma.   There is mental or physical violence at home.  Document Released: 08/28/2001 Document Revised: 05/16/2011 Document Reviewed: 03/02/2009 [employer] Patient Information 2012 [employer]. 

## 2011-08-01 ENCOUNTER — Ambulatory Visit (INDEPENDENT_AMBULATORY_CARE_PROVIDER_SITE_OTHER): Payer: Self-pay | Admitting: Family Medicine

## 2011-08-01 ENCOUNTER — Other Ambulatory Visit: Payer: Self-pay

## 2011-08-01 DIAGNOSIS — Z331 Pregnant state, incidental: Secondary | ICD-10-CM

## 2011-08-01 DIAGNOSIS — O09529 Supervision of elderly multigravida, unspecified trimester: Secondary | ICD-10-CM

## 2011-08-01 NOTE — Progress Notes (Signed)
Patient without complaints.  Denies vaginal bleeding, abnormal vaginal discharge, contractions, loss of fluid.  Having some increased pelvic pressure.  Declines cervical exam.  Reports good fetal activity.  Follow up in 1 weeks.

## 2011-08-01 NOTE — Patient Instructions (Signed)
Pregnancy - Third Trimester The third trimester of pregnancy (the last 3 months) is a period of the most rapid growth for you and your baby. The baby approaches a length of 20 inches and a weight of 6 to 10 pounds. The baby is adding on fat and getting ready for life outside your body. While inside, babies have periods of sleeping and waking, suck their thumbs, and hiccups. You can often feel small contractions of the uterus. This is false labor. It is also called Braxton-Hicks contractions. This is like a practice for labor. The usual problems in this stage of pregnancy include more difficulty breathing, swelling of the hands and feet from water retention, and having to urinate more often because of the uterus and baby pressing on your bladder.  PRENATAL EXAMS  Blood work may continue to be done during prenatal exams. These tests are done to check on your health and the probable health of your baby. Blood work is used to follow your blood levels (hemoglobin). Anemia (low hemoglobin) is common during pregnancy. Iron and vitamins are given to help prevent this. You may also continue to be checked for diabetes. Some of the past blood tests may be done again.   The size of the uterus is measured during each visit. This makes sure your baby is growing properly according to your pregnancy dates.   Your blood pressure is checked every prenatal visit. This is to make sure you are not getting toxemia.   Your urine is checked every prenatal visit for infection, diabetes and protein.   Your weight is checked at each visit. This is done to make sure gains are happening at the suggested rate and that you and your baby are growing normally.   Sometimes, an ultrasound is performed to confirm the position and the proper growth and development of the baby. This is a test done that bounces harmless sound waves off the baby so your caregiver can more accurately determine due dates.   Discuss the type of pain  medication and anesthesia you will have during your labor and delivery.   Discuss the possibility and anesthesia if a Cesarean Section might be necessary.   Inform your caregiver if there is any mental or physical violence at home.  Sometimes, a specialized non-stress test, contraction stress test and biophysical profile are done to make sure the baby is not having a problem. Checking the amniotic fluid surrounding the baby is called an amniocentesis. The amniotic fluid is removed by sticking a needle into the belly (abdomen). This is sometimes done near the end of pregnancy if an early delivery is required. In this case, it is done to help make sure the baby's lungs are mature enough for the baby to live outside of the womb. If the lungs are not mature and it is unsafe to deliver the baby, an injection of cortisone medication is given to the mother 1 to 2 days before the delivery. This helps the baby's lungs mature and makes it safer to deliver the baby. CHANGES OCCURING IN THE THIRD TRIMESTER OF PREGNANCY Your body goes through many changes during pregnancy. They vary from person to person. Talk to your caregiver about changes you notice and are concerned about.  During the last trimester, you have probably had an increase in your appetite. It is normal to have cravings for certain foods. This varies from person to person and pregnancy to pregnancy.   You may begin to get stretch marks on your hips,   abdomen, and breasts. These are normal changes in the body during pregnancy. There are no exercises or medications to take which prevent this change.   Constipation may be treated with a stool softener or adding bulk to your diet. Drinking lots of fluids, fiber in vegetables, fruits, and whole grains are helpful.   Exercising is also helpful. If you have been very active up until your pregnancy, most of these activities can be continued during your pregnancy. If you have been less active, it is helpful  to start an exercise program such as walking. Consult your caregiver before starting exercise programs.   Avoid all smoking, alcohol, un-prescribed drugs, herbs and "street drugs" during your pregnancy. These chemicals affect the formation and growth of the baby. Avoid chemicals throughout the pregnancy to ensure the delivery of a healthy infant.   Backache, varicose veins and hemorrhoids may develop or get worse.   You will tire more easily in the third trimester, which is normal.   The baby's movements may be stronger and more often.   You may become short of breath easily.   Your belly button may stick out.   A yellow discharge may leak from your breasts called colostrum.   You may have a bloody mucus discharge. This usually occurs a few days to a week before labor begins.  HOME CARE INSTRUCTIONS   Keep your caregiver's appointments. Follow your caregiver's instructions regarding medication use, exercise, and diet.   During pregnancy, you are providing food for you and your baby. Continue to eat regular, well-balanced meals. Choose foods such as meat, fish, milk and other low fat dairy products, vegetables, fruits, and whole-grain breads and cereals. Your caregiver will tell you of the ideal weight gain.   A physical sexual relationship may be continued throughout pregnancy if there are no other problems such as early (premature) leaking of amniotic fluid from the membranes, vaginal bleeding, or belly (abdominal) pain.   Exercise regularly if there are no restrictions. Check with your caregiver if you are unsure of the safety of your exercises. Greater weight gain will occur in the last 2 trimesters of pregnancy. Exercising helps:   Control your weight.   Get you in shape for labor and delivery.   You lose weight after you deliver.   Rest a lot with legs elevated, or as needed for leg cramps or low back pain.   Wear a good support or jogging bra for breast tenderness during  pregnancy. This may help if worn during sleep. Pads or tissues may be used in the bra if you are leaking colostrum.   Do not use hot tubs, steam rooms, or saunas.   Wear your seat belt when driving. This protects you and your baby if you are in an accident.   Avoid raw meat, cat litter boxes and soil used by cats. These carry germs that can cause birth defects in the baby.   It is easier to loose urine during pregnancy. Tightening up and strengthening the pelvic muscles will help with this problem. You can practice stopping your urination while you are going to the bathroom. These are the same muscles you need to strengthen. It is also the muscles you would use if you were trying to stop from passing gas. You can practice tightening these muscles up 10 times a set and repeating this about 3 times per day. Once you know what muscles to tighten up, do not perform these exercises during urination. It is more likely   to cause an infection by backing up the urine.   Ask for help if you have financial, counseling or nutritional needs during pregnancy. Your caregiver will be able to offer counseling for these needs as well as refer you for other special needs.   Make a list of emergency phone numbers and have them available.   Plan on getting help from family or friends when you go home from the hospital.   Make a trial run to the hospital.   Take prenatal classes with the father to understand, practice and ask questions about the labor and delivery.   Prepare the baby's room/nursery.   Do not travel out of the city unless it is absolutely necessary and with the advice of your caregiver.   Wear only low or no heal shoes to have better balance and prevent falling.  MEDICATIONS AND DRUG USE IN PREGNANCY  Take prenatal vitamins as directed. The vitamin should contain 1 milligram of folic acid. Keep all vitamins out of reach of children. Only a couple vitamins or tablets containing iron may be fatal  to a baby or young child when ingested.   Avoid use of all medications, including herbs, over-the-counter medications, not prescribed or suggested by your caregiver. Only take over-the-counter or prescription medicines for pain, discomfort, or fever as directed by your caregiver. Do not use aspirin, ibuprofen (Motrin, Advil, Nuprin) or naproxen (Aleve) unless OK'd by your caregiver.   Let your caregiver also know about herbs you may be using.   Alcohol is related to a number of birth defects. This includes fetal alcohol syndrome. All alcohol, in any form, should be avoided completely. Smoking will cause low birth rate and premature babies.   Street/illegal drugs are very harmful to the baby. They are absolutely forbidden. A baby born to an addicted mother will be addicted at birth. The baby will go through the same withdrawal an adult does.  SEEK MEDICAL CARE IF: You have any concerns or worries during your pregnancy. It is better to call with your questions if you feel they cannot wait, rather than worry about them. DECISIONS ABOUT CIRCUMCISION You may or may not know the sex of your baby. If you know your baby is a boy, it may be time to think about circumcision. Circumcision is the removal of the foreskin of the penis. This is the skin that covers the sensitive end of the penis. There is no proven medical need for this. Often this decision is made on what is popular at the time or based upon religious beliefs and social issues. You can discuss these issues with your caregiver or pediatrician. SEEK IMMEDIATE MEDICAL CARE IF:   An unexplained oral temperature above 102 F (38.9 C) develops, or as your caregiver suggests.   You have leaking of fluid from the vagina (birth canal). If leaking membranes are suspected, take your temperature and tell your caregiver of this when you call.   There is vaginal spotting, bleeding or passing clots. Tell your caregiver of the amount and how many pads are  used.   You develop a bad smelling vaginal discharge with a change in the color from clear to white.   You develop vomiting that lasts more than 24 hours.   You develop chills or fever.   You develop shortness of breath.   You develop burning on urination.   You loose more than 2 pounds of weight or gain more than 2 pounds of weight or as suggested by your   caregiver.   You notice sudden swelling of your face, hands, and feet or legs.   You develop belly (abdominal) pain. Round ligament discomfort is a common non-cancerous (benign) cause of abdominal pain in pregnancy. Your caregiver still must evaluate you.   You develop a severe headache that does not go away.   You develop visual problems, blurred or double vision.   If you have not felt your baby move for more than 1 hour. If you think the baby is not moving as much as usual, eat something with sugar in it and lie down on your left side for an hour. The baby should move at least 4 to 5 times per hour. Call right away if your baby moves less than that.   You fall, are in a car accident or any kind of trauma.   There is mental or physical violence at home.  Document Released: 08/28/2001 Document Revised: 05/16/2011 Document Reviewed: 03/02/2009 [employer] Patient Information 2012 [employer]. 

## 2011-08-01 NOTE — Progress Notes (Signed)
P=84 , c/o trace edema feet only,c/o pain upper abdomen, pelvic pressure- states like baby is going to come out- states pressure has increased, but states doesn't want to be checked.

## 2011-08-06 ENCOUNTER — Other Ambulatory Visit: Payer: Self-pay | Admitting: Obstetrics & Gynecology

## 2011-08-06 ENCOUNTER — Ambulatory Visit (INDEPENDENT_AMBULATORY_CARE_PROVIDER_SITE_OTHER): Payer: Self-pay | Admitting: Family Medicine

## 2011-08-06 DIAGNOSIS — O09529 Supervision of elderly multigravida, unspecified trimester: Secondary | ICD-10-CM

## 2011-08-06 DIAGNOSIS — Z3480 Encounter for supervision of other normal pregnancy, unspecified trimester: Secondary | ICD-10-CM

## 2011-08-06 NOTE — Patient Instructions (Addendum)
Normal Labor and Delivery Your caregiver must first be sure you are in labor. Signs of labor include:  You may pass what is called "the mucus plug" before labor begins. This is a small amount of blood stained mucus.   Regular uterine contractions.   The time between contractions get closer together.   The discomfort and pain gradually gets more intense.   Pains are mostly located in the back.   Pains get worse when walking.   The cervix (the opening of the uterus becomes thinner (begins to efface) and opens up (dilates).  Once you are in labor and admitted into the hospital or care center, your caregiver will do the following:  A complete physical examination.   Check your vital signs (blood pressure, pulse, temperature and the fetal heart rate).   Do a vaginal examination (using a sterile glove and lubricant) to determine:   The position (presentation) of the baby (head [vertex] or buttock first).   The level (station) of the baby's head in the birth canal.   The effacement and dilatation of the cervix.   You may have your pubic hair shaved and be given an enema depending on your caregiver and the circumstance.   An electronic monitor is usually placed on your abdomen. The monitor follows the length and intensity of the contractions, as well as the baby's heart rate.   Usually, your caregiver will insert an IV in your arm with a bottle of sugar water. This is done as a precaution so that medications can be given to you quickly during labor or delivery.  NORMAL LABOR AND DELIVERY IS DIVIDED UP INTO 3 STAGES: First Stage This is when regular contractions begin and the cervix begins to efface and dilate. This stage can last from 3 to 15 hours. The end of the first stage is when the cervix is 100% effaced and 10 centimeters dilated. Pain medications may be given by   Injection (morphine, demerol, etc.)   Regional anesthesia (spinal, caudal or epidural, anesthetics given in  different locations of the spine). Paracervical pain medication may be given, which is an injection of and anesthetic on each side of the cervix.  A pregnant woman may request to have "Natural Childbirth" which is not to have any medications or anesthesia during her labor and delivery. Second Stage This is when the baby comes down through the birth canal (vagina) and is born. This can take 1 to 4 hours. As the baby's head comes down through the birth canal, you may feel like you are going to have a bowel movement. You will get the urge to bear down and push until the baby is delivered. As the baby's head is being delivered, the caregiver will decide if an episiotomy (a cut in the perineum and vagina area) is needed to prevent tearing of the tissue in this area. The episiotomy is sewn up after the delivery of the baby and placenta. Sometimes a mask with nitrous oxide is given for the mother to breath during the delivery of the baby to help if there is too much pain. The end of Stage 2 is when the baby is fully delivered. Then when the umbilical cord stops pulsating it is clamped and cut. Third Stage The third stage begins after the baby is completely delivered and ends after the placenta (afterbirth) is delivered. This usually takes 5 to 30 minutes. After the placenta is delivered, a medication is given either by intravenous or injection to help contract   the uterus and prevent bleeding. The third stage is not painful and pain medication is usually not necessary. If an episiotomy was done, it is repaired at this time. After the delivery, the mother is watched and monitored closely for 1 to 2 hours to make sure there is no postpartum bleeding (hemorrhage). If there is a lot of bleeding, medication is given to contract the uterus and stop the bleeding. Document Released: 06/12/2008 Document Revised: 05/16/2011 Document Reviewed: 06/12/2008 [employer] Patient Information 2012 [employer]. Birth Control  Choices Birth control is the use of any practices, methods, or devices to prevent pregnancy from happening in a sexually active woman.  Below are some birth control choices to help avoid pregnancy.  Not having sex (abstinence) is the surest form of birth control. This requires self-control. There is no risk of acquiring a sexually transmitted disease (STD), including acquired immunodeficiency syndrome (AIDS).   Periodic abstinence requires self-control during certain times of the month.   Calendar method, timing your menstrual periods from month to month.   Ovulation method is avoiding sexual intercourse around the time you produce an egg (ovulate).   Symptotherm method is avoiding sexual intercourse at the time of ovulation, using a thermometer and ovulation symptoms.   Post ovulation method is the timing of sexual intercourse after you ovulated.  These methods do not protect against STDs, including AIDS.  Birth control pills (BCPs) contain estrogen and progesterone hormone. These medicines work by stopping the egg from forming in the ovary (ovulation). Birth control pills are prescribed by a caregiver who will ask you questions about the risks of taking BCPs. Birth control pills do not protect against STDs, including AIDS.   "Minipill" birth control pills have only the progesterone hormone. They are taken every day of each month and must be prescribed by your caregiver. They do not protect against STDs, including AIDS.   Emergency contraception is often call the "morning after" pill. This pill can be taken right after sex or up to five days after sex if you think your birth control failed, you failed to use contraception, or you were forced to have sex. It is most effective the sooner you take the pills after having sexual intercourse. Do not use emergency contraception as your only form of birth control. Emergency contraceptive pills are available without a prescription. Check with your  pharmacist.   Condoms are a thin sheath of latex, synthetic material, or lambskin worn over the penis during sexual intercourse. They can have a spermicide in or on them when you buy them. Latex condoms can prevent pregnancy and STDs. "Natural" or lambskin condoms can prevent pregnancy but may not protect against STDs, including AIDS.   Female condoms are a soft, loose-fitting sheath that is put into the vagina before sexual intercourse. They can prevent pregnancy and STDs, including AIDS.   Sponge is a soft, circular piece of polyurethane foam with spermicide in it that is inserted into the vagina after wetting it and before sexual intercourse. It does not require a prescription from your caregiver. It does not protect against STDs, including AIDS.   Diaphragm is a soft, latex, dome-shaped barrier that must be fitted by a caregiver. It is inserted into the vagina, along with a spermicidal jelly. After the proper fitting for a diaphragm, always insert the diaphragm before intercourse. The diaphragm should be left in the vagina for 6 to 8 hours after intercourse. Removal and reinsertion with a spermicide is always necessary after any use. It   does not protect against STDs, including AIDS.   Progesterone-only injections are given every 3 months to prevent pregnancy. These injections contain synthetic progesterone and no estrogen. This hormone stops the ovaries from releasing eggs. It also causes the cervical mucus to thicken and changes the uterine lining. This makes it harder for sperm to survive in the uterus. It does not protect against STDs, including AIDS.   Birth Control Patch contains hormones similar to those in birth control pills, so effectiveness, risks, and side effects are similar. It must be changed once a week and is prescribed by a caregiver. It is less effective in very overweight women. It does not protect against STDs, including AIDS.   Vaginal Ring contains hormones similar to those in  birth control pills. It is left in place for 3 weeks, removed for 1 week, and then a new one is put back into the vagina. It comes with a timer to put in your purse to help you remember when to take it out or put a new one in. A caregiver's examination and prescription is necessary, just like with birth control pills and the patch. It does not protect against STDs, including AIDS.   Estrogen plus progesterone injections are given every 28 to 30 days. They can be given in the upper arm, thigh, or buttocks. It does not protect against STDs, including AIDS.   Intrauterine device (IUD): copper T or progestin filled is a T-shaped device that is put in a woman's uterus during a menstrual period to prevent pregnancy. The copper T IUD can last 10 years, and the progestin IUD can last 5 years. The progestin IUD can also help control heavy menstrual periods. It does not protect against STDs, including AIDS. The copper T IUD can be used as emergency contraception if inserted within 5 days of having unprotected intercourse.   Cervical cap is a round, soft latex or plastic cup that fits over the cervix and must be fitted by a caregiver. You do not need to use a spermicide with it or remove and insert it every time you have sexual intercourse. It does not protect against STDs, including AIDS.   Spermicides are chemicals that kill or block sperm from entering the cervix and uterus. They come in the form of creams, jellies, suppositories, foam, or tablets, and they do not require a prescription. They are inserted into the vagina with an applicator before having sexual intercourse. This must be repeated every time you have sexual intercourse.   Withdrawal is using the method of the female withdrawing his penis from sexual intercourse before he has a climax and deposits his sperm. It does not protect against STDs, including AIDS.   Female tubal ligation is when the woman's fallopian tubes are surgically sealed or tied to  prevent the egg from traveling to the uterus. It does not protect against STDs, including AIDS.   Female sterilization is when the female has his tubes that carry sperm tied off (vasectomy) to stop sperm from entering the vagina during sexual intercourse. It does not protect against STDs, including AIDS.  Regardless of which method of birth control you choose, it is still important that you use some form of protection against STDs. Document Released: 09/03/2005 Document Revised: 10/06/2010 Document Reviewed: 07/21/2009 [employer] Patient Information 2012 [employer]. Breastfeeding BENEFITS OF BREASTFEEDING For the baby  The first milk (colostrum) helps the baby's digestive system function better.   There are antibodies from the mother in the milk that help   the baby fight off infections.   The baby has a lower incidence of asthma, allergies, and SIDS (sudden infant death syndrome).   The nutrients in breast milk are better than formulas for the baby and helps the baby's brain grow better.   Babies who breastfeed have less gas, colic, and constipation.  For the mother  Breastfeeding helps develop a very special bond between mother and baby.   It is more convenient, always available at the correct temperature and cheaper than formula feeding.   It burns calories in the mother and helps with losing weight that was gained during pregnancy.   It makes the uterus contract back down to normal size faster and slows bleeding following delivery.   Breastfeeding mothers have a lower risk of developing breast cancer.  NURSE FREQUENTLY  A healthy, full-term baby may breastfeed as often as every hour or space his or her feedings to every 3 hours.   How often to nurse will vary from baby to baby. Watch your baby for signs of hunger, not the clock.   Nurse as often as the baby requests, or when you feel the need to reduce the fullness of your breasts.   Awaken the baby if it has been 3 to 4 hours  since the last feeding.   Frequent feeding will help the mother make more milk and will prevent problems like sore nipples and engorgement of the breasts.  BABY'S POSITION AT THE BREAST  Whether lying down or sitting, be sure that the baby's tummy is facing your tummy.   Support the breast with 4 fingers underneath the breast and the thumb above. Make sure your fingers are well away from the nipple and baby's mouth.   Stroke the baby's lips and cheek closest to the breast gently with your finger or nipple.   When the baby's mouth is open wide enough, place all of your nipple and as much of the dark area around the nipple as possible into your baby's mouth.   Pull the baby in close so the tip of the nose and the baby's cheeks touch the breast during the feeding.  FEEDINGS  The length of each feeding varies from baby to baby and from feeding to feeding.   The baby must suck about 2 to 3 minutes for your milk to get to him or her. This is called a "let down." For this reason, allow the baby to feed on each breast as long as he or she wants. Your baby will end the feeding when he or she has received the right balance of nutrients.   To break the suction, put your finger into the corner of the baby's mouth and slide it between his or her gums before removing your breast from his or her mouth. This will help prevent sore nipples.  REDUCING BREAST ENGORGEMENT  In the first week after your baby is born, you may experience signs of breast engorgement. When breasts are engorged, they feel heavy, warm, full, and may be tender to the touch. You can reduce engorgement if you:   Nurse frequently, every 2 to 3 hours. Mothers who breastfeed early and often have fewer problems with engorgement.   Place light ice packs on your breasts between feedings. This reduces swelling. Wrap the ice packs in a lightweight towel to protect your skin.   Apply moist hot packs to your breast for 5 to 10 minutes before  each feeding. This increases circulation and helps the milk flow.     Gently massage your breast before and during the feeding.   Make sure that the baby empties at least one breast at every feeding before switching sides.   Use a breast pump to empty the breasts if your baby is sleepy or not nursing well. You may also want to pump if you are returning to work or or you feel you are getting engorged.   Avoid bottle feeds, pacifiers or supplemental feedings of water or juice in place of breastfeeding.   Be sure the baby is latched on and positioned properly while breastfeeding.   Prevent fatigue, stress, and anemia.   Wear a supportive bra, avoiding underwire styles.   Eat a balanced diet with enough fluids.  If you follow these suggestions, your engorgement should improve in 24 to 48 hours. If you are still experiencing difficulty, call your lactation consultant or caregiver. IS MY BABY GETTING ENOUGH MILK? Sometimes, mothers worry about whether their babies are getting enough milk. You can be assured that your baby is getting enough milk if:  The baby is actively sucking and you hear swallowing.   The baby nurses at least 8 to 12 times in a 24 hour time period. Nurse your baby until he or she unlatches or falls asleep at the first breast (at least 10 to 20 minutes), then offer the second side.   The baby is wetting 5 to 6 disposable diapers (6 to 8 cloth diapers) in a 24 hour period by 5 to 6 days of age.   The baby is having at least 2 to 3 stools every 24 hours for the first few months. Breast milk is all the food your baby needs. It is not necessary for your baby to have water or formula. In fact, to help your breasts make more milk, it is best not to give your baby supplemental feedings during the early weeks.   The stool should be soft and yellow.   The baby should gain 4 to 7 ounces per week after he is 4 days old.  TAKE CARE OF YOURSELF Take care of your breasts by:  Bathing  or showering daily.   Avoiding the use of soaps on your nipples.   Start feedings on your left breast at one feeding and on your right breast at the next feeding.   You will notice an increase in your milk supply 2 to 5 days after delivery. You may feel some discomfort from engorgement, which makes your breasts very firm and often tender. Engorgement "peaks" out within 24 to 48 hours. In the meantime, apply warm moist towels to your breasts for 5 to 10 minutes before feeding. Gentle massage and expression of some milk before feeding will soften your breasts, making it easier for your baby to latch on. Wear a well fitting nursing bra and air dry your nipples for 10 to 15 minutes after each feeding.   Only use cotton bra pads.   Only use pure lanolin on your nipples after nursing. You do not need to wash it off before nursing.  Take care of yourself by:   Eating well-balanced meals and nutritious snacks.   Drinking milk, fruit juice, and water to satisfy your thirst (about 8 glasses a day).   Getting plenty of rest.   Increasing calcium in your diet (1200 mg a day).   Avoiding foods that you notice affect the baby in a bad way.  SEEK MEDICAL CARE IF:   You have any questions or difficulty with   breastfeeding.   You need help.   You have a hard, red, sore area on your breast, accompanied by a fever of 100.5 F (38.1 C) or more.   Your baby is too sleepy to eat well or is having trouble sleeping.   Your baby is wetting less than 6 diapers per day, by 5 days of age.   Your baby's skin or white part of his or her eyes is more yellow than it was in the hospital.   You feel depressed.  Document Released: 09/03/2005 Document Revised: 05/16/2011 Document Reviewed: 04/18/2009 [employer] Patient Information 2012 [employer]. 

## 2011-08-06 NOTE — Progress Notes (Signed)
Labor precautions

## 2011-08-12 ENCOUNTER — Encounter (HOSPITAL_COMMUNITY): Payer: Self-pay | Admitting: *Deleted

## 2011-08-12 ENCOUNTER — Inpatient Hospital Stay (HOSPITAL_COMMUNITY)
Admission: AD | Admit: 2011-08-12 | Discharge: 2011-08-14 | DRG: 775 | Disposition: A | Discharge status: Home / Self Care | Payer: Medicaid Other | Source: Ambulatory Visit | Attending: Obstetrics and Gynecology | Admitting: Obstetrics and Gynecology

## 2011-08-12 DIAGNOSIS — O09529 Supervision of elderly multigravida, unspecified trimester: Principal | ICD-10-CM | POA: Diagnosis present

## 2011-08-12 LAB — CBC
HCT: 40.8 % (ref 36.0–46.0)
MCV: 98.8 fL (ref 78.0–100.0)
Platelets: 239 10*3/uL (ref 150–400)
RBC: 4.13 MIL/uL (ref 3.87–5.11)
WBC: 8.6 10*3/uL (ref 4.0–10.5)

## 2011-08-12 MED ORDER — OXYCODONE-ACETAMINOPHEN 5-325 MG PO TABS: 2.0000 | ORAL_TABLET | ORAL | Status: DC | PRN

## 2011-08-12 MED ORDER — OXYTOCIN BOLUS FROM INFUSION
500.0000 mL | Freq: Once | INTRAVENOUS | Status: AC
  Administered 2011-08-12: 500 mL via INTRAVENOUS
  Filled 2011-08-12: qty 500
  Filled 2011-08-12: qty 1000

## 2011-08-12 MED ORDER — IBUPROFEN 600 MG PO TABS: 600.0000 mg | ORAL_TABLET | Freq: Four times a day (QID) | ORAL | Status: DC | PRN

## 2011-08-12 MED ORDER — FLEET ENEMA 7-19 GM/118ML RE ENEM: 1.0000 | ENEMA | RECTAL | Status: DC | PRN

## 2011-08-12 MED ORDER — IBUPROFEN 600 MG PO TABS
600.0000 mg | ORAL_TABLET | Freq: Four times a day (QID) | ORAL | Status: DC
  Administered 2011-08-12 – 2011-08-14 (×9): 600 mg via ORAL
  Filled 2011-08-12 (×7): qty 1

## 2011-08-12 MED ORDER — ACETAMINOPHEN 325 MG PO TABS: 650.0000 mg | ORAL_TABLET | ORAL | Status: DC | PRN

## 2011-08-12 MED ORDER — LIDOCAINE HCL (PF) 1 % IJ SOLN
30.0000 mL | INTRAMUSCULAR | Status: DC | PRN
  Filled 2011-08-12: qty 30

## 2011-08-12 MED ORDER — BENZOCAINE-MENTHOL 20-0.5 % EX AERO
INHALATION_SPRAY | CUTANEOUS | Status: AC
  Filled 2011-08-12: qty 56

## 2011-08-12 MED ORDER — ONDANSETRON HCL 4 MG PO TABS: 4.0000 mg | ORAL_TABLET | ORAL | Status: DC | PRN

## 2011-08-12 MED ORDER — PRENATAL PLUS 27-1 MG PO TABS
1.0000 | ORAL_TABLET | Freq: Every day | ORAL | Status: DC
  Administered 2011-08-12 – 2011-08-14 (×3): 1 via ORAL
  Filled 2011-08-12 (×2): qty 1

## 2011-08-12 MED ORDER — ONDANSETRON HCL 4 MG/2ML IJ SOLN: 4.0000 mg | INTRAMUSCULAR | Status: DC | PRN

## 2011-08-12 MED ORDER — NALBUPHINE HCL 10 MG/ML IJ SOLN
10.0000 mg | INTRAMUSCULAR | Status: DC | PRN
  Filled 2011-08-12: qty 1

## 2011-08-12 MED ORDER — NALBUPHINE HCL 10 MG/ML IJ SOLN
10.0000 mg | INTRAMUSCULAR | Status: DC | PRN
  Administered 2011-08-12: 10 mg via INTRAVENOUS
  Filled 2011-08-12: qty 1

## 2011-08-12 MED ORDER — DIBUCAINE 1 % RE OINT: 1.0000 "application " | TOPICAL_OINTMENT | RECTAL | Status: DC | PRN

## 2011-08-12 MED ORDER — OXYCODONE-ACETAMINOPHEN 5-325 MG PO TABS: 1.0000 | ORAL_TABLET | ORAL | Status: DC | PRN

## 2011-08-12 MED ORDER — DIPHENHYDRAMINE HCL 25 MG PO CAPS: 25.0000 mg | ORAL_CAPSULE | Freq: Four times a day (QID) | ORAL | Status: DC | PRN

## 2011-08-12 MED ORDER — NALBUPHINE SYRINGE 5 MG/0.5 ML
10.0000 mg | INJECTION | INTRAMUSCULAR | Status: DC | PRN
  Filled 2011-08-12: qty 1

## 2011-08-12 MED ORDER — SIMETHICONE 80 MG PO CHEW: 80.0000 mg | CHEWABLE_TABLET | ORAL | Status: DC | PRN

## 2011-08-12 MED ORDER — LANOLIN HYDROUS EX OINT: TOPICAL_OINTMENT | CUTANEOUS | Status: DC | PRN

## 2011-08-12 MED ORDER — CITRIC ACID-SODIUM CITRATE 334-500 MG/5ML PO SOLN: 30.0000 mL | ORAL | Status: DC | PRN

## 2011-08-12 MED ORDER — ZOLPIDEM TARTRATE 5 MG PO TABS: 5.0000 mg | ORAL_TABLET | Freq: Every evening | ORAL | Status: DC | PRN

## 2011-08-12 MED ORDER — WITCH HAZEL-GLYCERIN EX PADS: 1.0000 "application " | MEDICATED_PAD | CUTANEOUS | Status: DC | PRN

## 2011-08-12 MED ORDER — LACTATED RINGERS IV SOLN
INTRAVENOUS | Status: DC
  Administered 2011-08-12: 05:00:00 via INTRAVENOUS

## 2011-08-12 MED ORDER — OXYTOCIN 20 UNITS IN LACTATED RINGERS INFUSION - SIMPLE
125.0000 mL/h | Freq: Once | INTRAVENOUS | Status: AC
  Administered 2011-08-12: 125 mL/h via INTRAVENOUS

## 2011-08-12 MED ORDER — LACTATED RINGERS IV SOLN: 500.0000 mL | INTRAVENOUS | Status: DC | PRN

## 2011-08-12 MED ORDER — TETANUS-DIPHTH-ACELL PERTUSSIS 5-2.5-18.5 LF-MCG/0.5 IM SUSP: 0.5000 mL | Freq: Once | INTRAMUSCULAR | Status: DC

## 2011-08-12 MED ORDER — ONDANSETRON HCL 4 MG/2ML IJ SOLN: 4.0000 mg | Freq: Four times a day (QID) | INTRAMUSCULAR | Status: DC | PRN

## 2011-08-12 MED ORDER — BENZOCAINE-MENTHOL 20-0.5 % EX AERO: 1.0000 "application " | INHALATION_SPRAY | CUTANEOUS | Status: DC | PRN

## 2011-08-12 MED ORDER — SENNOSIDES-DOCUSATE SODIUM 8.6-50 MG PO TABS
2.0000 | ORAL_TABLET | Freq: Every day | ORAL | Status: DC
  Administered 2011-08-12 – 2011-08-13 (×2): 2 via ORAL

## 2011-08-12 NOTE — ED Notes (Signed)
Report called to Marisue Ivan in [facility]. Will go to RM [phone] from Triage.

## 2011-08-12 NOTE — Progress Notes (Signed)
Admission nutrition screen triggered. Patients chart reviewed and assessed  for nutritional risk. Patient is determined to be at low nutrition  risk.

## 2011-08-12 NOTE — Progress Notes (Signed)
G4P3 at 39.6wks. Leaking fld since 2030. Ctxs since 0030

## 2011-08-12 NOTE — H&P (Signed)
Joyce Garcia is a 35 y.o. female presenting for eval of labor. Denies large gush of fluid. Reports +FM. Has been seen for prenatal care since approx 27 wks at the [facility] and her course has been essentially unremarkable. Maternal Medical History:  Reason for admission: Reason for Admission:   nausea  OB History    Grav Para Term Preterm Abortions TAB SAB Ect Mult Living   7 3 3  3  3   3      Past Medical History  Diagnosis Date  . Abnormal Pap smear     at [facility] prior to becoming pregnant, unsure of what was abnormal   Past Surgical History  Procedure Date  . Dilation and curettage of uterus     miscarriage  . Breast surgery     had lump removed   Family History: family history includes Hypertension in her father and sisters. Social History:  reports that she has never smoked. She does not have any smokeless tobacco history on file. She reports that she does not drink alcohol or use illicit drugs.  Review of Systems  Constitutional: Negative for fever and chills.  Gastrointestinal: Negative for nausea and vomiting.  Genitourinary: Negative for dysuria.  Psychiatric/Behavioral: Negative for depression.    Dilation: 5 Effacement (%): 100 Station: 0 Exam by:: Isac Sarna, RN, C. Soliz, RN Blood pressure 126/93, pulse 76, temperature 98.2 F (36.8 C), temperature source Oral, resp. rate 20, height 5\' 5"  (1.651 m), weight 75.569 kg (166 lb 9.6 oz), last menstrual period 11/06/2010. Maternal Exam:  Uterine Assessment: Contraction frequency is regular.      Fetal Exam Fetal Monitor Review: Baseline rate: 140.  only on the monitor a short time now; reassuring, mod variability; no decels     Physical Exam  Constitutional: She is oriented to person, place, and time. She appears well-developed and well-nourished.  HENT:  Head: Normocephalic.  Neck: Normal range of motion.  Cardiovascular: Normal rate.   Respiratory: Effort normal.  Musculoskeletal: Normal range of motion.   Neurological: She is alert and oriented to person, place, and time. She has normal reflexes.  Skin: Skin is warm and dry.  Psychiatric: She has a normal mood and affect.    Prenatal labs: ABO, Rh: O/POS/-- (10/03 1004) Antibody: NEG (10/03 1004) Rubella: 247.1 (10/03 1004) RPR: NON REAC (10/03 1004)  HBsAg: NEGATIVE (10/03 1004)  HIV: NON REACTIVE (10/03 1004)  GBS:   neg 07/18/11  Assessment/Plan: IUP at term Labor  Will admit to L&D Anticipate SVD Expectant management   Cam Hai 08/12/2011, 4:32 AM

## 2011-08-13 NOTE — Progress Notes (Addendum)
I have seen and examined this patient in conjunction with Minhdan Nguyen, PA-S.  I have taken this history and performed the exam.  I agree with the note as written above and have made corrections as needed.   Demarco Bacci JEHIEL 08/13/2011 9:56 AM   

## 2011-08-13 NOTE — Progress Notes (Signed)
Post Partum Day 1 Subjective: up ad lib, voiding and tolerating PO. Patient reports tenderness and pain in across the lower abdomen.   Objective: Blood pressure 135/79, pulse 76, temperature 98.4 F (36.9 C), temperature source Oral, resp. rate 18, height 5\' 5"  (1.651 m), weight 75.569 kg (166 lb 9.6 oz), last menstrual period 11/06/2010, unknown if currently breastfeeding.  Physical Exam:  General: alert, cooperative and no distress Heart: regular rate and rhythm. No rubs, murmurs, gallops. Lungs: clear to auscultation bilaterally.  Abdomen: Tender with palpation across mid to lower abdomen. Active bowel sounds. Upper quadrants nontender and soft. Lochia: appropriate Uterine Fundus: firm DVT Evaluation: No cords or calf tenderness.   Basename 08/12/11 0430  HGB 14.2  HCT 40.8    Assessment/Plan: Plan for discharge tomorrow and Breastfeeding and bottle feeding. Plans on using birth control, will decide which one at 6 week visit.    LOS: 1 day   [name], PA-S 08/13/2011, 8:06 AM

## 2011-08-13 NOTE — Progress Notes (Signed)
UR chart review completed.  

## 2011-08-14 MED ORDER — SENNOSIDES-DOCUSATE SODIUM 8.6-50 MG PO TABS: 2.0000 | ORAL_TABLET | ORAL | Status: AC | PRN

## 2011-08-14 NOTE — Discharge Summary (Signed)
Pt seen and examined be me.  Agree with above.

## 2011-08-14 NOTE — Discharge Summary (Signed)
Obstetric Discharge Summary Reason for Admission: onset of labor Prenatal Procedures: none Intrapartum Procedures: spontaneous vaginal delivery Postpartum Procedures: none Complications-Operative and Postpartum: none Hemoglobin  Date Value Range Status  08/12/2011 14.2  12.0-15.0 (g/dL) Final     HCT  Date Value Range Status  08/12/2011 40.8  36.0-46.0 (%) Final    Discharge Diagnoses: Term Pregnancy-delivered  Discharge Information: Date: 08/14/2011 Activity: pelvic rest Diet: routine Medications: None Condition: stable Instructions: refer to practice specific booklet Discharge to: home   Newborn Data: Live born female  Birth Weight: 6 lb 12.6 oz (3079 g) APGAR: 9, 9  Home with mother.  Joyce Garcia, Joyce Garcia 08/14/2011, 12:06 PM

## 2011-08-14 NOTE — Progress Notes (Signed)
Pt seen and evaluated by me.  Agree with above.  Discussed with student.

## 2011-08-14 NOTE — Progress Notes (Signed)
Post Partum Day 2 Subjective: no complaints, up ad lib, voiding and tolerating PO. Abdominal pain reduced, however still mild. Patient reports she is ready to go home today.  Objective: Blood pressure 132/77, pulse 75, temperature 98.6 F (37 C), temperature source Oral, resp. rate 18, height 5\' 5"  (1.651 m), weight 75.569 kg (166 lb 9.6 oz), last menstrual period 11/06/2010, SpO2 98.00%, unknown if currently breastfeeding.  Physical Exam:  General: alert, cooperative and no distress Heart: Regular rate and rhythm. No murmurs, rubs, gallops. Lungs: clear to auscultation bilaterally. No wheezes, rhonchi, crackles Abdomen: active bowel sounds. Mild discomfort with palpation just inferior to navel.  Lochia: appropriate Uterine Fundus: firm DVT Evaluation: No cords or calf tenderness.   Basename 08/12/11 0430  HGB 14.2  HCT 40.8    Assessment/Plan: Discharge home, Breastfeeding and Contraception plans to obtain birth control, will do so at 6 week visit to [facility]. (patient is breast and bottle feeding). Patient is agreeable to go home today. Requests prescription for stool softeners for home use.   LOS: 2 days   Joyce Putt, PA-S 08/14/2011, 7:26 AM

## 2011-08-14 NOTE — H&P (Signed)
Agree with above note.  Joyce Garcia 08/14/2011 8:49 AM   

## 2011-08-14 NOTE — Discharge Summary (Signed)
Obstetric Discharge Summary Reason for Admission: onset of labor Prenatal Procedures: NST Intrapartum Procedures: spontaneous vaginal delivery Postpartum Procedures: none Complications-Operative and Postpartum: none Hemoglobin  Date Value Range Status  08/12/2011 14.2  12.0-15.0 (g/dL) Final     HCT  Date Value Range Status  08/12/2011 40.8  36.0-46.0 (%) Final    Discharge Diagnoses: Term Pregnancy-delivered Joyce Garcia is a 35YO [id number] who presented at 39 weeks 6 days with onset of labor and went onto NSVD with no anesthesia. 3 vessel cord spontaneous placenta, no lacerations. Apgar's 9 & 9. Mom is breast and bottle feeding. Will assess birth control at 6 week follow up.   Discharge Information: Date: 08/14/2011 Activity: pelvic rest Diet: routine Medications: PNV, Ibuprofen and Colace Condition: stable Instructions: refer to practice specific booklet Discharge to: home   Newborn Data: Live born female  Birth Weight: 6 lb 12.6 oz (3079 g) APGAR: 9, 9  Home with mother.  [name], PA-S 08/14/2011, 7:50 AM

## 2011-08-15 ENCOUNTER — Other Ambulatory Visit: Payer: Self-pay

## 2011-08-16 ENCOUNTER — Encounter (HOSPITAL_COMMUNITY): Payer: Self-pay | Admitting: *Deleted

## 2011-08-16 ENCOUNTER — Inpatient Hospital Stay (HOSPITAL_COMMUNITY)
Admission: AD | Admit: 2011-08-16 | Discharge: 2011-08-17 | Disposition: A | Discharge status: Home / Self Care | Payer: BC Managed Care – PPO | Source: Ambulatory Visit | Attending: Obstetrics & Gynecology | Admitting: Obstetrics & Gynecology

## 2011-08-16 DIAGNOSIS — N61 Mastitis without abscess: Secondary | ICD-10-CM

## 2011-08-16 DIAGNOSIS — O9122 Nonpurulent mastitis associated with the puerperium: Secondary | ICD-10-CM | POA: Insufficient documentation

## 2011-08-16 HISTORY — DX: Other specified postprocedural states: Z98.890

## 2011-08-16 NOTE — Progress Notes (Signed)
Pt states she is concerned about her breasts are very tender and she has some tissue hanging out of her vagina-statesher bleeding is less and less and is not concerned about that

## 2011-08-16 NOTE — Progress Notes (Signed)
PT  SAYS SHE DEL VAG 08-12-2011- BY DR  ?    BREAST/ BOTTLE FEEDING.   SAYS SHE IS PASSING CLOTS-  AT 10PM-  PASSED BLOOD CLOT-  THEN TISSUE CAME OUT- SHE PULLED ON IT- WOULD NOT COME- STOPPED- SAYS IT'S HANGING OUT.

## 2011-08-16 NOTE — Progress Notes (Signed)
ALSO SAYS BREAST ARE SORE- UNDERNEATH ARMS

## 2011-08-17 LAB — DIFFERENTIAL
Basophils Absolute: 0 10*3/uL (ref 0.0–0.1)
Eosinophils Relative: 1 % (ref 0–5)
Lymphocytes Relative: 19 % (ref 12–46)
Lymphs Abs: 2.2 10*3/uL (ref 0.7–4.0)
Monocytes Absolute: 0.6 10*3/uL (ref 0.1–1.0)

## 2011-08-17 LAB — CBC
HCT: 38.3 % (ref 36.0–46.0)
MCV: 99.2 fL (ref 78.0–100.0)
RDW: 14.9 % (ref 11.5–15.5)
WBC: 11.1 10*3/uL — ABNORMAL HIGH (ref 4.0–10.5)

## 2011-08-17 MED ORDER — HYDROMORPHONE HCL PF 1 MG/ML IJ SOLN
1.0000 mg | Freq: Once | INTRAMUSCULAR | Status: AC
  Administered 2011-08-17: 1 mg via INTRAVENOUS
  Filled 2011-08-17: qty 1

## 2011-08-17 MED ORDER — HYDROCODONE-ACETAMINOPHEN 5-500 MG PO TABS: 1.0000 | ORAL_TABLET | Freq: Four times a day (QID) | ORAL | Status: AC | PRN

## 2011-08-17 MED ORDER — METHYLERGONOVINE MALEATE 0.2 MG PO TABS: 0.2000 mg | ORAL_TABLET | Freq: Once | ORAL | Status: DC

## 2011-08-17 MED ORDER — PIPERACILLIN-TAZOBACTAM 3.375 G IVPB: 3.3750 g | Freq: Four times a day (QID) | INTRAVENOUS | Status: DC

## 2011-08-17 MED ORDER — PIPERACILLIN-TAZOBACTAM 3.375 G IVPB
3.3750 g | Freq: Once | INTRAVENOUS | Status: DC
  Administered 2011-08-17: 3.375 g via INTRAVENOUS
  Filled 2011-08-17: qty 50

## 2011-08-17 MED ORDER — CEPHALEXIN 500 MG PO CAPS: 500.0000 mg | ORAL_CAPSULE | Freq: Four times a day (QID) | ORAL | Status: AC

## 2011-08-17 MED ORDER — SODIUM CHLORIDE 0.9 % IV SOLN
INTRAVENOUS | Status: DC
  Administered 2011-08-17: 01:00:00 via INTRAVENOUS

## 2011-08-17 MED ORDER — METRONIDAZOLE 500 MG PO TABS: 500.0000 mg | ORAL_TABLET | Freq: Three times a day (TID) | ORAL | Status: AC

## 2011-08-17 MED ORDER — HYDROMORPHONE HCL PF 1 MG/ML IJ SOLN
1.0000 mg | Freq: Once | INTRAMUSCULAR | Status: DC
  Filled 2011-08-17: qty 1

## 2011-08-17 NOTE — ED Provider Notes (Signed)
Pt seen and examined.  Trailing membranes from the os easily removed with ring forcep.  No other retained products noted in LUS on digital exam.  No bleeding.  Cx 2 cm open.   Breasts are engorged and very tender in right breast lower outer quadrant.  Probably mastitis. Will treat with keflex and flagyl.  Pt does not have insurance and cannot afford augmentin.  This antibiotic regimen will cover gram positives, some gram negatives, and anerobes. Pt needs follow up appt Monday afternoon in gyn clinic.  Mayra Neer will call.

## 2011-08-17 NOTE — ED Provider Notes (Signed)
History     No chief complaint on file.  HPI Joyce Garcia 35 y.o. female  [phone]  presenting with tissue in vagina, breast pain, abdominal pain after SVD on 11/25 with discharge on 11/27.    Patient reports at around 10:00 AM on 11/29 noticing a blood clot pass from her vagina. She went to the bathroom and noticed some tissue hanging out which did not come out when pulled. She has had increasing lower abdominal pain since discharge as well. Denies fever. Has had chills occasionally.   Patient has been attempting to breastfeed but says child has not latched on. She has bottle fed primarily since discharge. Breasts have become very tender and engorged.   OB History    Grav Para Term Preterm Abortions TAB SAB Ect Mult Living   7 4 4  3  3   4       Past Medical History  Diagnosis Date  . Abnormal Pap smear     at [facility] prior to becoming pregnant, unsure of what was abnormal  . History of breast surgery     removed a lump    Past Surgical History  Procedure Date  . Dilation and curettage of uterus     miscarriage  . Breast surgery     had lump removed    Family History  Problem Relation Age of Onset  . Hypertension Father   . Hypertension Sister   . Hypertension Sister   . Hypertension Sister     History  Substance Use Topics  . Smoking status: Never Smoker   . Smokeless tobacco: Never Used  . Alcohol Use: No    Allergies: No Known Allergies  Prescriptions prior to admission  Medication Sig Dispense Refill  . ibuprofen (ADVIL,MOTRIN) 200 MG tablet Take 200 mg by mouth every 6 (six) hours as needed. For pain       . prenatal vitamin w/FE, FA (PRENATAL 1 + 1) 27-1 MG TABS Take 1 tablet by mouth daily.        Marland Kitchen senna-docusate (SENOKOT-S) 8.6-50 MG per tablet Take 2 tablets by mouth as needed for constipation (daily).  30 tablet  0    ROS Physical Exam   Blood pressure 154/76, pulse 92, temperature 100.2 F (37.9 C), temperature source Oral, resp. rate 20,  height 5\' 1"  (1.549 m), weight 71.838 kg (158 lb 6 oz), last menstrual period 11/06/2010, unknown if currently breastfeeding.  Physical Exam  Constitutional: She appears well-developed and well-nourished. She appears distressed.       Tearful.   Cardiovascular: Normal rate and regular rhythm.  Exam reveals no gallop and no friction rub.   No murmur heard. Respiratory: Breath sounds normal. No respiratory distress. She has no wheezes. She has no rales.  GI: Soft. Bowel sounds are normal. There is no rebound and no guarding.       Light diffuse tenderness. Firm uterus with moderate tenderness.   Genitourinary: There is breast swelling and tenderness (noted to be very firm bilaterally with engorged breast tissue to axilla. R >L. ). There is bleeding around the vagina. Foreign body: patient has memranes at her introitus. Attempted speculum exam but patient very nervous/tender and pulls away.     MAU Course  Procedures  MDM  Problem #1: retained products now s/p extraction  CBC   Lab 08/17/11 0015  WBC 11.1*  HGB 12.9  HCT 38.3  PLT 290  Gave patient 1 mg of Dilaudid and reassessed due to discomfort  on initial speculum exam.  Dr. Penne Lash performed repeat speculum exam-membranes were extracted with ring forceps. Bimanual exam was performed and no exquisite tenderness was noted.  Given 1x PO dose of Methergine   Problem #2: Infectious Disease with mastitis and retained products Due to temperature to 100.2, concern for mastitis and possible retained products-gave patient 1 dose of zosyn in house 10 day course of  keflex and flagyl to cover for both infections Breasts pumped in MAU. Ice packs to breasts.   Assessment and Plan  #1 [id number] with recent SVD #2 Mastitis #3 retained products now s/p extraction  Discharge home.  -10 day course keflex and flagyl to treat both potential sources of infection -Ibuprofen for pain control with VIcodin for breakthrough pain.  -instructed to pump  breasts every 2 hours. Ice packs. Told to call lactation consultant tomorrow.    Patient seen and examined by Dr. Benay Pike sent for cosign.   Tana Conch, MD, PGY1 08/17/2011, 12:01 AM

## 2011-08-17 NOTE — Progress Notes (Signed)
R.Dawson,RN in to room and working with pt and the breast pump-pt insructed on how to massage breast down to promote milk release from the milk ducts furtherest away from the nipple-pt receiptve to the instruction and getting good results from the breast pump

## 2011-08-20 ENCOUNTER — Ambulatory Visit (INDEPENDENT_AMBULATORY_CARE_PROVIDER_SITE_OTHER): Payer: Self-pay | Admitting: Advanced Practice Midwife

## 2011-08-20 ENCOUNTER — Encounter: Payer: Self-pay | Admitting: Advanced Practice Midwife

## 2011-08-20 VITALS — BP 139/83 | HR 74 | Temp 98.2°F | Ht 60.0 in | Wt 155.4 lb

## 2011-08-20 DIAGNOSIS — Z3480 Encounter for supervision of other normal pregnancy, unspecified trimester: Secondary | ICD-10-CM

## 2011-08-20 DIAGNOSIS — N61 Mastitis without abscess: Secondary | ICD-10-CM

## 2011-08-20 LAB — POCT URINALYSIS DIP (DEVICE)
Bilirubin Urine: NEGATIVE
Glucose, UA: NEGATIVE mg/dL
Hgb urine dipstick: NEGATIVE
Ketones, ur: NEGATIVE mg/dL
Specific Gravity, Urine: 1.025 (ref 1.005–1.030)
Urobilinogen, UA: 0.2 mg/dL (ref 0.0–1.0)

## 2011-08-20 NOTE — Progress Notes (Signed)
Reports vaginal discharge of pinkish tissue (small amount).

## 2011-08-20 NOTE — Patient Instructions (Signed)
Breastfeeding BENEFITS OF BREASTFEEDING For the baby  The first milk (colostrum) helps the baby's digestive system function better.   There are antibodies from the mother in the milk that help the baby fight off infections.   The baby has a lower incidence of asthma, allergies, and SIDS (sudden infant death syndrome).   The nutrients in breast milk are better than formulas for the baby and helps the baby's brain grow better.   Babies who breastfeed have less gas, colic, and constipation.  For the mother  Breastfeeding helps develop a very special bond between mother and baby.   It is more convenient, always available at the correct temperature and cheaper than formula feeding.   It burns calories in the mother and helps with losing weight that was gained during pregnancy.   It makes the uterus contract back down to normal size faster and slows bleeding following delivery.   Breastfeeding mothers have a lower risk of developing breast cancer.  NURSE FREQUENTLY  A healthy, full-term baby may breastfeed as often as every hour or space his or her feedings to every 3 hours.   How often to nurse will vary from baby to baby. Watch your baby for signs of hunger, not the clock.   Nurse as often as the baby requests, or when you feel the need to reduce the fullness of your breasts.   Awaken the baby if it has been 3 to 4 hours since the last feeding.   Frequent feeding will help the mother make more milk and will prevent problems like sore nipples and engorgement of the breasts.  BABY'S POSITION AT THE BREAST  Whether lying down or sitting, be sure that the baby's tummy is facing your tummy.   Support the breast with 4 fingers underneath the breast and the thumb above. Make sure your fingers are well away from the nipple and baby's mouth.   Stroke the baby's lips and cheek closest to the breast gently with your finger or nipple.   When the baby's mouth is open wide enough, place  all of your nipple and as much of the dark area around the nipple as possible into your baby's mouth.   Pull the baby in close so the tip of the nose and the baby's cheeks touch the breast during the feeding.  FEEDINGS  The length of each feeding varies from baby to baby and from feeding to feeding.   The baby must suck about 2 to 3 minutes for your milk to get to him or her. This is called a "let down." For this reason, allow the baby to feed on each breast as long as he or she wants. Your baby will end the feeding when he or she has received the right balance of nutrients.   To break the suction, put your finger into the corner of the baby's mouth and slide it between his or her gums before removing your breast from his or her mouth. This will help prevent sore nipples.  REDUCING BREAST ENGORGEMENT  In the first week after your baby is born, you may experience signs of breast engorgement. When breasts are engorged, they feel heavy, warm, full, and may be tender to the touch. You can reduce engorgement if you:   Nurse frequently, every 2 to 3 hours. Mothers who breastfeed early and often have fewer problems with engorgement.   Place light ice packs on your breasts between feedings. This reduces swelling. Wrap the ice packs in a   lightweight towel to protect your skin.   Apply moist hot packs to your breast for 5 to 10 minutes before each feeding. This increases circulation and helps the milk flow.   Gently massage your breast before and during the feeding.   Make sure that the baby empties at least one breast at every feeding before switching sides.   Use a breast pump to empty the breasts if your baby is sleepy or not nursing well. You may also want to pump if you are returning to work or or you feel you are getting engorged.   Avoid bottle feeds, pacifiers or supplemental feedings of water or juice in place of breastfeeding.   Be sure the baby is latched on and positioned properly while  breastfeeding.   Prevent fatigue, stress, and anemia.   Wear a supportive bra, avoiding underwire styles.   Eat a balanced diet with enough fluids.  If you follow these suggestions, your engorgement should improve in 24 to 48 hours. If you are still experiencing difficulty, call your lactation consultant or caregiver. IS MY BABY GETTING ENOUGH MILK? Sometimes, mothers worry about whether their babies are getting enough milk. You can be assured that your baby is getting enough milk if:  The baby is actively sucking and you hear swallowing.   The baby nurses at least 8 to 12 times in a 24 hour time period. Nurse your baby until he or she unlatches or falls asleep at the first breast (at least 10 to 20 minutes), then offer the second side.   The baby is wetting 5 to 6 disposable diapers (6 to 8 cloth diapers) in a 24 hour period by 5 to 6 days of age.   The baby is having at least 2 to 3 stools every 24 hours for the first few months. Breast milk is all the food your baby needs. It is not necessary for your baby to have water or formula. In fact, to help your breasts make more milk, it is best not to give your baby supplemental feedings during the early weeks.   The stool should be soft and yellow.   The baby should gain 4 to 7 ounces per week after he is 4 days old.  TAKE CARE OF YOURSELF Take care of your breasts by:  Bathing or showering daily.   Avoiding the use of soaps on your nipples.   Start feedings on your left breast at one feeding and on your right breast at the next feeding.   You will notice an increase in your milk supply 2 to 5 days after delivery. You may feel some discomfort from engorgement, which makes your breasts very firm and often tender. Engorgement "peaks" out within 24 to 48 hours. In the meantime, apply warm moist towels to your breasts for 5 to 10 minutes before feeding. Gentle massage and expression of some milk before feeding will soften your breasts, making  it easier for your baby to latch on. Wear a well fitting nursing bra and air dry your nipples for 10 to 15 minutes after each feeding.   Only use cotton bra pads.   Only use pure lanolin on your nipples after nursing. You do not need to wash it off before nursing.  Take care of yourself by:   Eating well-balanced meals and nutritious snacks.   Drinking milk, fruit juice, and water to satisfy your thirst (about 8 glasses a day).   Getting plenty of rest.   Increasing calcium in   your diet (1200 mg a day).   Avoiding foods that you notice affect the baby in a bad way.  SEEK MEDICAL CARE IF:   You have any questions or difficulty with breastfeeding.   You need help.   You have a hard, red, sore area on your breast, accompanied by a fever of 100.5 F (38.1 C) or more.   Your baby is too sleepy to eat well or is having trouble sleeping.   Your baby is wetting less than 6 diapers per day, by 5 days of age.   Your baby's skin or white part of his or her eyes is more yellow than it was in the hospital.   You feel depressed.  Document Released: 09/03/2005 Document Revised: 05/16/2011 Document Reviewed: 04/18/2009 [employer] Patient Information 2012 [employer]. 

## 2011-08-20 NOTE — Progress Notes (Signed)
  Subjective:    Patient ID: Joyce Garcia, female    DOB: Apr 20, 1976, 35 y.o.   MRN: [id number]  HPI This is a 35 y.o. female who delivered via SVD on 08/12/11 and presented 4 days later to MAU with c/o bleeding and breast pain. Was found to have trailing membranes which were removed by Dr Penne Lash. She was also deemed to have bilateral mastitis with engorgement and placed on Keflex for treatment. She presents here for check of her status. States she thinks she has fevers at home but does not take temp and treats with tylenol successfully. States has "pink tissue in small amounts" that comes out  But no bleeding. No significant uterine tenderness but has some low pelvic cramping. Is breastfeeding but not consistently due to nipple confusion and supplementing. Has not pumped or fed since last night. States baby will not latch because she has been bottle fed.     Review of Systems As above    Objective:   Physical Exam  Constitutional: She is oriented to person, place, and time. She appears well-developed and well-nourished.  HENT:  Head: Normocephalic.  Cardiovascular: Normal rate.   Pulmonary/Chest: Effort normal.       Bilateral breasts engorged, but no erethema.   Abdominal: Soft.  Genitourinary: Vagina normal and uterus normal. No vaginal discharge found.       Uterus nontender, no discharge seen.  Involuting.  Musculoskeletal: Normal range of motion.  Neurological: She is alert and oriented to person, place, and time.  Skin: Skin is warm and dry.  Psychiatric: She has a normal mood and affect.          Assessment & Plan:  A:  Postpartum Day # 8      Previous retained POCs.  Patient is "very concerned" over pink tissue and is worried she has more retained products.       Resolving mastitis, with primary engorgement due to infrequent breastfeeding   P:  Discussed with Dr Shawnie Pons:  Will check [place] of uterus to reassure patient      Continue Keflex      Encouraged to take  temp and report any over 100.5      Offered Lact. Consult, pt refused. States breastfed successfully before.      Encouraged baby to breast q 2 hours

## 2011-08-23 ENCOUNTER — Ambulatory Visit (HOSPITAL_COMMUNITY)
Admission: RE | Admit: 2011-08-23 | Discharge: 2011-08-23 | Disposition: A | Discharge status: Home / Self Care | Payer: BC Managed Care – PPO | Source: Ambulatory Visit | Attending: Advanced Practice Midwife | Admitting: Advanced Practice Midwife

## 2011-08-23 DIAGNOSIS — O99893 Other specified diseases and conditions complicating puerperium: Secondary | ICD-10-CM | POA: Insufficient documentation

## 2011-09-13 ENCOUNTER — Encounter (HOSPITAL_COMMUNITY): Payer: Self-pay | Admitting: *Deleted

## 2011-09-13 ENCOUNTER — Ambulatory Visit (INDEPENDENT_AMBULATORY_CARE_PROVIDER_SITE_OTHER): Payer: Self-pay | Admitting: Obstetrics and Gynecology

## 2011-09-13 ENCOUNTER — Inpatient Hospital Stay (HOSPITAL_COMMUNITY)
Admission: AD | Admit: 2011-09-13 | Discharge: 2011-09-13 | Disposition: A | Discharge status: Home / Self Care | Payer: BC Managed Care – PPO | Source: Ambulatory Visit | Attending: Obstetrics & Gynecology | Admitting: Obstetrics & Gynecology

## 2011-09-13 DIAGNOSIS — Z3480 Encounter for supervision of other normal pregnancy, unspecified trimester: Secondary | ICD-10-CM

## 2011-09-13 DIAGNOSIS — O135 Gestational [pregnancy-induced] hypertension without significant proteinuria, complicating the puerperium: Secondary | ICD-10-CM | POA: Insufficient documentation

## 2011-09-13 LAB — CBC
Hemoglobin: 14.6 g/dL (ref 12.0–15.0)
MCH: 33.3 pg (ref 26.0–34.0)
MCV: 98.4 fL (ref 78.0–100.0)
Platelets: 223 10*3/uL (ref 150–400)
RBC: 4.39 MIL/uL (ref 3.87–5.11)
WBC: 7.2 10*3/uL (ref 4.0–10.5)

## 2011-09-13 LAB — COMPREHENSIVE METABOLIC PANEL
ALT: 25 U/L (ref 0–35)
AST: 19 U/L (ref 0–37)
Alkaline Phosphatase: 76 U/L (ref 39–117)
CO2: 26 mEq/L (ref 19–32)
Calcium: 9.3 mg/dL (ref 8.4–10.5)
GFR calc Af Amer: 87 mL/min — ABNORMAL LOW (ref >90)
GFR calc non Af Amer: 75 mL/min — ABNORMAL LOW (ref >90)
Glucose, Bld: 85 mg/dL (ref 70–99)
Potassium: 3.8 mEq/L (ref 3.5–5.1)
Sodium: 138 mEq/L (ref 135–145)
Total Protein: 7.8 g/dL (ref 6.0–8.3)

## 2011-09-13 LAB — PROTEIN / CREATININE RATIO, URINE: Protein Creatinine Ratio: 0.05 (ref 0.00–0.15)

## 2011-09-13 MED ORDER — HYDROCHLOROTHIAZIDE 12.5 MG PO CAPS: 12.5000 mg | ORAL_CAPSULE | Freq: Every day | ORAL | Status: DC

## 2011-09-13 NOTE — Progress Notes (Signed)
Patient was sent from the [facility] for evaluation of elevated BP. Patient had a SVD on 11-25 with no BP problems.

## 2011-09-13 NOTE — ED Provider Notes (Signed)
History     Chief Complaint  Patient presents with  . Hypertension   HPI this is a 35 year old G7 P4 034 was seen today at GYN clinic for routine 4 weeks postpartum  check (please see note). She was normotensive during the hospitalization and had an uncomplicated NSVD. She was noted to have blood pressure elevations repeatedly in the upper 150s over upper 90s. She also has been having mild headaches. She denies visual disturbance or epigastric pain. Declines Tylenol for headache now.   Past Medical History  Diagnosis Date  . Abnormal Pap smear     at HD prior to becoming pregnant, unsure of what was abnormal  . History of breast surgery     removed a lump    Past Surgical History  Procedure Date  . Dilation and curettage of uterus     miscarriage  . Breast surgery     had lump removed    Family History  Problem Relation Age of Onset  . Hypertension Father   . Hypertension Sister   . Hypertension Sister   . Hypertension Sister     History  Substance Use Topics  . Smoking status: Never Smoker   . Smokeless tobacco: Never Used  . Alcohol Use: No    Allergies: No Known Allergies  Prescriptions prior to admission  Medication Sig Dispense Refill  . ibuprofen (ADVIL,MOTRIN) 200 MG tablet Take 200 mg by mouth every 6 (six) hours as needed. For pain       . prenatal vitamin w/FE, FA (PRENATAL 1 + 1) 27-1 MG TABS Take 1 tablet by mouth daily.        Marland Kitchen senna-docusate (SENOKOT-S) 8.6-50 MG per tablet Take 2 tablets by mouth as needed for constipation (daily).  30 tablet  0    Review of Systems  Constitutional: Negative for fever and chills.  Eyes: Negative for blurred vision, double vision and pain.  Genitourinary: Negative for dysuria.  Neurological: Positive for headaches. Negative for dizziness.   Physical Exam   Blood pressure 153/99, pulse 63, temperature 99.5 F (37 C), temperature source Oral, resp. rate 16, height 5' 2.5" (1.588 m), weight 67.586 kg (149 lb),  SpO2 98.00%, currently breastfeeding.  Physical Exam  Constitutional: She is oriented to person, place, and time. She appears well-developed and well-nourished. No distress.  HENT:  Head: Normocephalic.  Eyes: EOM are normal. Pupils are equal, round, and reactive to light.  Neck: Neck supple.  Cardiovascular: Normal rate and regular rhythm.   Respiratory: Effort normal.  GI: Soft. There is no tenderness.  Musculoskeletal: Normal range of motion.  Neurological: She is alert and oriented to person, place, and time. She has normal reflexes.  Skin: Skin is warm and dry.  Psychiatric: She has a normal mood and affect.    MAU Course  Procedures  Serial BPs: 149-161/91-100  Results for orders placed during the hospital encounter of 09/13/11 (from the past 24 hour(s))  CBC     Status: Normal   Collection Time   09/13/11  5:59 PM      Component Value Range   WBC 7.2  4.0 - 10.5 (K/uL)   RBC 4.39  3.87 - 5.11 (MIL/uL)   Hemoglobin 14.6  12.0 - 15.0 (g/dL)   HCT 21.3  08.6 - 57.8 (%)   MCV 98.4  78.0 - 100.0 (fL)   MCH 33.3  26.0 - 34.0 (pg)   MCHC 33.8  30.0 - 36.0 (g/dL)   RDW 46.9  62.9 -  15.5 (%)   Platelets 223  150 - 400 (K/uL)  COMPREHENSIVE METABOLIC PANEL     Status: Abnormal   Collection Time   09/13/11  5:59 PM      Component Value Range   Sodium 138  135 - 145 (mEq/L)   Potassium 3.8  3.5 - 5.1 (mEq/L)   Chloride 103  96 - 112 (mEq/L)   CO2 26  19 - 32 (mEq/L)   Glucose, Bld 85  70 - 99 (mg/dL)   BUN 15  6 - 23 (mg/dL)   Creatinine, Ser 9.60  0.50 - 1.10 (mg/dL)   Calcium 9.3  8.4 - 45.4 (mg/dL)   Total Protein 7.8  6.0 - 8.3 (g/dL)   Albumin 3.6  3.5 - 5.2 (g/dL)   AST 19  0 - 37 (U/L)   ALT 25  0 - 35 (U/L)   Alkaline Phosphatase 76  39 - 117 (U/L)   Total Bilirubin 0.5  0.3 - 1.2 (mg/dL)   GFR calc non Af Amer 75 (*) >90 (mL/min)   GFR calc Af Amer 87 (*) >90 (mL/min)   MDM   Care turned over to [place]Dishmon CNM at 19:55 due to pending urine protein  :creatinine ratio which is in process at [facility]   Assessment and Plan    POE,DEIRDRE 09/13/2011, 5:58 PM    Addendum  Results for orders placed during the hospital encounter of 09/13/11 (from the past 24 hour(s))  CBC     Status: Normal   Collection Time   09/13/11  5:59 PM      Component Value Range   WBC 7.2  4.0 - 10.5 (K/uL)   RBC 4.39  3.87 - 5.11 (MIL/uL)   Hemoglobin 14.6  12.0 - 15.0 (g/dL)   HCT 09.8  11.9 - 14.7 (%)   MCV 98.4  78.0 - 100.0 (fL)   MCH 33.3  26.0 - 34.0 (pg)   MCHC 33.8  30.0 - 36.0 (g/dL)   RDW 82.9  56.2 - 13.0 (%)   Platelets 223  150 - 400 (K/uL)  COMPREHENSIVE METABOLIC PANEL     Status: Abnormal   Collection Time   09/13/11  5:59 PM      Component Value Range   Sodium 138  135 - 145 (mEq/L)   Potassium 3.8  3.5 - 5.1 (mEq/L)   Chloride 103  96 - 112 (mEq/L)   CO2 26  19 - 32 (mEq/L)   Glucose, Bld 85  70 - 99 (mg/dL)   BUN 15  6 - 23 (mg/dL)   Creatinine, Ser 8.65  0.50 - 1.10 (mg/dL)   Calcium 9.3  8.4 - 78.4 (mg/dL)   Total Protein 7.8  6.0 - 8.3 (g/dL)   Albumin 3.6  3.5 - 5.2 (g/dL)   AST 19  0 - 37 (U/L)   ALT 25  0 - 35 (U/L)   Alkaline Phosphatase 76  39 - 117 (U/L)   Total Bilirubin 0.5  0.3 - 1.2 (mg/dL)   GFR calc non Af Amer 75 (*) >90 (mL/min)   GFR calc Af Amer 87 (*) >90 (mL/min)  PROTEIN / CREATININE RATIO, URINE     Status: Normal   Collection Time   09/13/11  6:08 PM      Component Value Range   Creatinine, Urine 83.16     Total Protein, Urine 4.4     PROTEIN CREATININE RATIO 0.05  0.00 - 0.15    MDM Pr/Cr ratio came  back at 0.05 Will give HCTZ 12.5mg  and have patient follow up in clinic in 2 weeks.   Assessment/Plan #54 35 year old G7 P4 034  #2 4 weeks postpartum #3 Hypertension  Hypertension does not appear related to preeclampsia. Will give HCTZ 12.5mg  and have patient follow up in clinic in 2 weeks. Advise patient to follow up sooner if has worsening headaches, shortness of breath, increasing swelling,  changes in vision.   Case discussed with DEIRDRE POE, CNM  Durene Cal, STEPHEN 09/13/2011 8:55 PM

## 2011-09-13 NOTE — Progress Notes (Signed)
Pt sent from the clinic with elevated BP.

## 2011-09-13 NOTE — Progress Notes (Signed)
  Subjective:    Patient ID: Joyce Garcia, female    DOB: Nov 30, 1975, 35 y.o.   MRN: [id number]  HPI 35 yo [id number] s/p SVD on 11/25 presenting today for postpartum check. Patient is doing well and without complaints. Patient is breastfeeding and is getting as much rest as she can. Patient is receiving ample help and is denies si/sx of postpartum depression. Patient is unhappy with the fact that there were retained products of conception following delivery that required further intervention. Patient is fearful that there is further retained POC even thoutgh ultrasound performed on 08/23/2011 did not show any findings suspicious for retained POC. Patient has not had intercourse and is interested in Mirena IUD for birth control   Review of Systems     Objective:   Physical Exam GENERAL: Well-developed, well-nourished female in no acute distress.  HEENT: Normocephalic, atraumatic. Sclerae anicteric.  NECK: Supple. Normal thyroid.  LUNGS: Clear to auscultation bilaterally.  HEART: Regular rate and rhythm. BREASTS: Symmetric in size. No palpable masses or engorgement. ABDOMEN: Soft, nontender, nondistended. No organomegaly. PELVIC: Normal external female genitalia. Vagina is pink and rugated.  Normal discharge. Normal appearing cervix. Uterus is normal in size. No adnexal mass or tenderness. EXTREMITIES: No cyanosis, clubbing, or edema, 2+ distal pulses.     Assessment & Plan:  35 yo [id number] s/p SVD on 11/25 here for postpartum check - Patient reassurance provided - Patient with elevated BP on 2 occasions 160/96 and 160/98. Patient reports headache. Will send to [facility]U to r/o pp preeclampsia - Mirena IUD scholorship form provided - Patient to schedule appointment for insertion upon approval - Patient to RTC in 6 months for annual exam.

## 2011-09-13 NOTE — Progress Notes (Signed)
Pt states she has been worried because she had to return to [facility]  Related to retained placenta

## 2011-09-17 NOTE — ED Provider Notes (Signed)
Agree with above note.  Joyce Garcia 09/17/2011 1:05 PM

## 2011-10-01 ENCOUNTER — Encounter: Payer: Self-pay | Admitting: Obstetrics & Gynecology

## 2011-10-01 ENCOUNTER — Ambulatory Visit (INDEPENDENT_AMBULATORY_CARE_PROVIDER_SITE_OTHER): Payer: Self-pay | Admitting: Obstetrics & Gynecology

## 2011-10-01 VITALS — BP 131/79 | Temp 98.4°F | Wt 148.8 lb

## 2011-10-01 DIAGNOSIS — I87309 Chronic venous hypertension (idiopathic) without complications of unspecified lower extremity: Secondary | ICD-10-CM

## 2011-10-01 NOTE — Progress Notes (Signed)
BP and patient history reported to Dr. Penne Lash- instructed patient to continue hydrochlorothiazide and will refer to [facility] for hypertension - patient informed we will send referral and they will contact her with appointment. Patient in agreement with plan.

## 2011-10-04 ENCOUNTER — Encounter: Payer: Self-pay | Admitting: Obstetrics & Gynecology

## 2011-10-15 ENCOUNTER — Ambulatory Visit (INDEPENDENT_AMBULATORY_CARE_PROVIDER_SITE_OTHER): Payer: BC Managed Care – PPO | Admitting: Family Medicine

## 2011-10-15 ENCOUNTER — Encounter: Payer: Self-pay | Admitting: Family Medicine

## 2011-10-15 DIAGNOSIS — R030 Elevated blood-pressure reading, without diagnosis of hypertension: Secondary | ICD-10-CM

## 2011-10-15 DIAGNOSIS — Z309 Encounter for contraceptive management, unspecified: Secondary | ICD-10-CM

## 2011-10-15 NOTE — Progress Notes (Signed)
  Subjective:    Patient ID: Joyce Garcia, female    DOB: 1976-03-15, 36 y.o.   MRN: [id number]  HPI Patient here to establish care as a new patient.  Past history, surgical history, family history, social history, meds, allergies all updated and appropriate tabs.  Blood pressure: Diagnosed with hypertension at 6 weeks postpartum visit. Had no hypertension during pregnancy or previous to pregnancy. Was given HCTZ at 6 week postpartum visit. Patient took but stopped taking this one to 2 weeks ago. Patient states she has been checking blood pressure with home cuff but feels that is incorrect. Sometimes will be 130s systolic and then later will be up to 160 systolic. On patient arrival today blood pressure 131/84. On recheck was 160/ 96. At time of recheck have patient use home cuff as well and this gave a consistent reading with my manual check. Patient states that she does not want to be on blood pressure medication at this time. Has strong family history of hypertension. No headache. No dizziness. No changes in vision.  Birth control: Patient states that she is now 8 weeks postpartum. Interested in IUD or implement on for birth control. States she is not sure what she would like. Would like to discuss in more detail.  Review of Systems As per above.    Objective:   Physical Exam  Constitutional: She appears well-developed and well-nourished.  HENT:  Head: Normocephalic and atraumatic.  Neck: No thyromegaly present.  Cardiovascular: Normal rate, regular rhythm and normal heart sounds.   No murmur heard. Pulmonary/Chest: No respiratory distress. She has no wheezes. She has no rales.  Abdominal: Soft. She exhibits no distension.  Musculoskeletal: She exhibits no edema.  Neurological: She is alert.          Assessment & Plan:

## 2011-10-15 NOTE — Patient Instructions (Signed)
Blood pressure: I know that you don't want to start a medication at this time. I do feel we may need to in the future.  Lets just monitor your bloodpressure a few x per week at home, write it down in a piece of paper.  Return to see me in 1 month.

## 2011-10-17 NOTE — Assessment & Plan Note (Signed)
Pt would like to do a month trial off blood pressure med-- has been off for 1-2 weeks already.  Pt to monitor blood pressure at home and write down values.  Pt to return in 1 month so we can evaluate and decide if pt has true diagnosis of hypertension and what would be the best medicine to use.  I explained that with  bp elevated at time of my eval of 160/96, earlier today 131/84-- could be 2/2 stress.  But still has strong family h/o htn so very likely that pt has hypertension.  Pt to return in 1 month.

## 2011-10-17 NOTE — Assessment & Plan Note (Addendum)
Discussed birth control options.  Pt is going to do research and let me know what she would like to do. Until then will use condoms.

## 2011-11-14 ENCOUNTER — Ambulatory Visit (INDEPENDENT_AMBULATORY_CARE_PROVIDER_SITE_OTHER): Payer: BC Managed Care – PPO | Admitting: Family Medicine

## 2011-11-14 ENCOUNTER — Encounter: Payer: Self-pay | Admitting: Family Medicine

## 2011-11-14 VITALS — BP 144/84 | HR 68 | Temp 98.1°F | Ht 64.0 in | Wt 150.6 lb

## 2011-11-14 DIAGNOSIS — Z309 Encounter for contraceptive management, unspecified: Secondary | ICD-10-CM

## 2011-11-14 NOTE — Patient Instructions (Signed)
Make an appointment for nexplanon insertion for the month of March.

## 2011-11-22 NOTE — Assessment & Plan Note (Signed)
See note above.  Pt to schedule appt for implanon insertion in march 2013.

## 2011-11-22 NOTE — Progress Notes (Signed)
  Subjective:    Patient ID: Joyce Garcia, female    DOB: [birth year], 36 y.o.   MRN: [id number]  HPI Patient here for insertion Implanon: At last appointment we discussed forms of birth control. Asked patient to call me when she decided what form of birth control that she would want. Patient states that she called and spoke to someone at the office and scheduled her for Implanon. This was not communicated to me in there was not an Implanon reserved for her. We'll not charge her for today's visit. Will have her return during March. I have reserved and Implanon insertion for her. Patient is aware that she will need to pay $65 for insertion on day of next appointment.  Patient is aware that she needs to use other form of birth control until next appointment.   Review of Systems     Objective:   Physical Exam        Assessment & Plan:

## 2011-11-23 ENCOUNTER — Ambulatory Visit: Payer: BC Managed Care – PPO | Admitting: Family Medicine

## 2011-11-28 ENCOUNTER — Encounter: Payer: Self-pay | Admitting: Family Medicine

## 2011-11-28 ENCOUNTER — Ambulatory Visit (INDEPENDENT_AMBULATORY_CARE_PROVIDER_SITE_OTHER): Payer: BC Managed Care – PPO | Admitting: Family Medicine

## 2011-11-28 VITALS — BP 153/92 | HR 69 | Ht 65.0 in | Wt 147.0 lb

## 2011-11-28 DIAGNOSIS — Z30017 Encounter for initial prescription of implantable subdermal contraceptive: Secondary | ICD-10-CM

## 2011-11-28 DIAGNOSIS — Z309 Encounter for contraceptive management, unspecified: Secondary | ICD-10-CM

## 2011-11-28 DIAGNOSIS — Z3046 Encounter for surveillance of implantable subdermal contraceptive: Secondary | ICD-10-CM

## 2011-11-28 MED ORDER — ETONOGESTREL 68 MG ~~LOC~~ IMPL
68.0000 mg | DRUG_IMPLANT | Freq: Once | SUBCUTANEOUS | Status: AC
  Administered 2011-11-28: 68 mg via SUBCUTANEOUS

## 2011-12-04 NOTE — Progress Notes (Signed)
Patient ID: Joyce Garcia, female   DOB: Sep 07, 1976, 36 y.o.   MRN: [id number]  Nexplanon insertion: Risk and benefits discussed with pt.  Pt states understanding.  Consent sign formed.  Pt placed in proper position with left arm on arm board.  Target site marked 8cm from medial epicondyle.  Site cleaned with betadine and alcohol.  2cc of 1% lidocaine injected at target site and along planned insertion track.  Using sterile technique implanon inserted without problems.  Pt tolerated well.  No blood loss. Had pt feel implanon beneath skin to educate on location and feel of device. Steri strip placed on insertion site.  Pressure dressing placed on arm. Pt given instructions on care of site.  Reviewed red flags for return-fever, site redness, edema.  Pt states understanding.

## 2012-09-01 ENCOUNTER — Encounter: Payer: Self-pay | Admitting: Family Medicine

## 2012-09-01 ENCOUNTER — Ambulatory Visit (INDEPENDENT_AMBULATORY_CARE_PROVIDER_SITE_OTHER): Payer: Self-pay | Admitting: Family Medicine

## 2012-09-01 VITALS — BP 130/80 | HR 80 | Ht 65.0 in | Wt 153.8 lb

## 2012-09-01 DIAGNOSIS — R51 Headache: Secondary | ICD-10-CM

## 2012-09-01 DIAGNOSIS — M545 Low back pain: Secondary | ICD-10-CM

## 2012-09-01 DIAGNOSIS — Z309 Encounter for contraceptive management, unspecified: Secondary | ICD-10-CM

## 2012-09-01 NOTE — Progress Notes (Signed)
HPI: Joyce Garcia is a 36 y.o. female [phone] here to discuss side effects of her Nexplanon, and possibly have it removed.  She got the Nexplanon earlier this year around March, and since that time had been having headaches and back pain, both of which she attributes to the Nexplanon.   She had some back pain prior to getting the Nexplanon, but feels these have gotten worse since getting it. The pain is in the middle of her lower back. It is worst when she is sitting or standing for long periods of time, and when she stretches. Able to control bowel and bladder normally. No weakness in legs. The headaches are generalized, not focal in one area of her head. Nothing seems to trigger them. She gets them about twice per week. They do respond to ibuprofen but she prefers not to take medications frequently.  In the past, she has used just condoms for birth control. She firmly states she does not want to get pregnant again. She has just one female sexual partner. She had spotting for the first 3 months of having the nexplanon, but no longer gets periods. She is unsure that she would be compliant with condoms if she were to get the nexplanon out. She recently lost her health insurance, so funding for future methods of birth control will be limited.  ROS: See HPI  PHYSICAL EXAM: BP 130/80  Pulse 80  Ht 5\' 5"  (1.651 m)  Wt 153 lb 12.8 oz (69.763 kg)  BMI 25.59 kg/m2 Gen: NAD, pleasant, cooperative Heart: RRR Lungs: CTAB Abd: soft, nontender, nondistended. Back: mildly tender to palpation over lower back in the lumbar region Neuro: nonfocal exam- EOMI, PERRL, speech intact, grip 5/5 bilaterally, lower extremity 5/5 with knee flexion and extension & hip flexion.

## 2012-09-01 NOTE — Patient Instructions (Addendum)
It was nice to meet you today!  I am sorry you are having so much back pain, and headaches. You can try taking ibuprofen or tylenol for the headaches and back pain. If the pain gets a lot worse, schedule an appointment to come back. Below are some exercises you can do for your back.  Work on getting the [employer]. Once you get it, call the office and we'll refer you to physical therapy.  If you decide you want to get the Implanon out, call and schedule an appointment and we can take it out.   Back Exercises Back exercises help treat and prevent back injuries. The goal of back exercises is to increase the strength of your abdominal and back muscles and the flexibility of your back. These exercises should be started when you no longer have back pain. Back exercises include:  Pelvic Tilt. Lie on your back with your knees bent. Tilt your pelvis until the lower part of your back is against the floor. Hold this position 5 to 10 sec and repeat 5 to 10 times.  Knee to Chest. Pull first 1 knee up against your chest and hold for 20 to 30 seconds, repeat this with the other knee, and then both knees. This may be done with the other leg straight or bent, whichever feels better.  Sit-Ups or Curl-Ups. Bend your knees 90 degrees. Start with tilting your pelvis, and do a partial, slow sit-up, lifting your trunk only 30 to 45 degrees off the floor. Take at least 2 to 3 seconds for each sit-up. Do not do sit-ups with your knees out straight. If partial sit-ups are difficult, simply do the above but with only tightening your abdominal muscles and holding it as directed.  Hip-Lift. Lie on your back with your knees flexed 90 degrees. Push down with your feet and shoulders as you raise your hips a couple inches off the floor; hold for 10 seconds, repeat 5 to 10 times.  Back arches. Lie on your stomach, propping yourself up on bent elbows. Slowly press on your hands, causing an arch in your low back. Repeat 3 to  5 times. Any initial stiffness and discomfort should lessen with repetition over time.  Shoulder-Lifts. Lie face down with arms beside your body. Keep hips and torso pressed to floor as you slowly lift your head and shoulders off the floor. Do not overdo your exercises, especially in the beginning. Exercises may cause you some mild back discomfort which lasts for a few minutes; however, if the pain is more severe, or lasts for more than 15 minutes, do not continue exercises until you see your caregiver. Improvement with exercise therapy for back problems is slow.  See your caregivers for assistance with developing a proper back exercise program.

## 2012-09-03 DIAGNOSIS — M545 Low back pain: Secondary | ICD-10-CM | POA: Insufficient documentation

## 2012-09-03 NOTE — Assessment & Plan Note (Signed)
Pt reports headache as a side effect of Nexplanon. Neuro exam normal today. Advised use of tylenol or ibuprofen as needed for pain. If worsens, she can f/u and we can talk about removing nexplanon or pursue other management.

## 2012-09-03 NOTE — Assessment & Plan Note (Signed)
Pt reports low back pain as side effect of nexplanon. Pain seems musculoskeletal in nature, as it is worse with stretching and after standing/sitting for long periods. Given handout on exercises she can do for her back. She will work on getting the [employer], and once she has it, we can refer her to physical therapy. No red flags (weakness in legs, loss of bowel/bladder function). If worsens, she can f/u and we can talk about removing nexplanon if she truly feels this is a side effect.

## 2012-09-03 NOTE — Assessment & Plan Note (Signed)
Pt reporting side effects of headache and back pain from nexplanon. These are both on the manufacturer's list of known side effects. After discussing positives and negatives of removing nexplanon today, patient elects to continue to keep it in. If the side effects continue to be a problem for her, offered for her to schedule an appointment for [place] to take it out.  Also spoke with her about funding for vasectomies at the health department, but patient did not seem very interested in this option.

## 2013-06-01 ENCOUNTER — Encounter: Payer: Self-pay | Admitting: Internal Medicine

## 2013-06-01 ENCOUNTER — Ambulatory Visit: Payer: No Typology Code available for payment source | Attending: Internal Medicine | Admitting: Internal Medicine

## 2013-06-01 VITALS — BP 129/76 | HR 73 | Temp 98.4°F | Resp 16 | Ht 64.17 in | Wt 150.0 lb

## 2013-06-01 DIAGNOSIS — G8929 Other chronic pain: Secondary | ICD-10-CM | POA: Insufficient documentation

## 2013-06-01 DIAGNOSIS — M545 Low back pain, unspecified: Secondary | ICD-10-CM | POA: Insufficient documentation

## 2013-06-01 DIAGNOSIS — M549 Dorsalgia, unspecified: Secondary | ICD-10-CM

## 2013-06-01 LAB — COMPREHENSIVE METABOLIC PANEL
Albumin: 4.2 g/dL (ref 3.5–5.2)
CO2: 27 mEq/L (ref 19–32)
Calcium: 9.3 mg/dL (ref 8.4–10.5)
Chloride: 107 mEq/L (ref 96–112)
Glucose, Bld: 84 mg/dL (ref 70–99)
Total Bilirubin: 0.7 mg/dL (ref 0.3–1.2)
Total Protein: 7.4 g/dL (ref 6.0–8.3)

## 2013-06-01 LAB — LIPID PANEL
HDL: 36 mg/dL — ABNORMAL LOW (ref >39)
LDL Cholesterol: 111 mg/dL — ABNORMAL HIGH (ref 0–99)
Triglycerides: 81 mg/dL (ref <150)
VLDL: 16 mg/dL (ref 0–40)

## 2013-06-01 LAB — CBC WITH DIFFERENTIAL/PLATELET
Basophils Absolute: 0 10*3/uL (ref 0.0–0.1)
Lymphocytes Relative: 44 % (ref 12–46)
Lymphs Abs: 2.3 10*3/uL (ref 0.7–4.0)
Neutrophils Relative %: 45 % (ref 43–77)
Platelets: 319 10*3/uL (ref 150–400)
RBC: 4.36 MIL/uL (ref 3.87–5.11)
WBC: 5.1 10*3/uL (ref 4.0–10.5)

## 2013-06-01 NOTE — Patient Instructions (Signed)
Back Pain, Adult  Low back pain is very common. About 1 in 5 people have back pain. The cause of low back pain is rarely dangerous. The pain often gets better over time. About half of people with a sudden onset of back pain feel better in just 2 weeks. About 8 in 10 people feel better by 6 weeks.   CAUSES  Some common causes of back pain include:  · Strain of the muscles or ligaments supporting the spine.  · Wear and tear (degeneration) of the spinal discs.  · Arthritis.  · Direct injury to the back.  DIAGNOSIS  Most of the time, the direct cause of low back pain is not known. However, back pain can be treated effectively even when the exact cause of the pain is unknown. Answering your caregiver's questions about your overall health and symptoms is one of the most accurate ways to make sure the cause of your pain is not dangerous. If your caregiver needs more information, he or she may order lab work or imaging tests (X-rays or MRIs). However, even if imaging tests show changes in your back, this usually does not require surgery.  HOME CARE INSTRUCTIONS  For many people, back pain returns. Since low back pain is rarely dangerous, it is often a condition that people can learn to manage on their own.   · Remain active. It is stressful on the back to sit or stand in one place. Do not sit, drive, or stand in one place for more than 30 minutes at a time. Take short walks on level surfaces as soon as pain allows. Try to increase the length of time you walk each day.  · Do not stay in bed. Resting more than 1 or 2 days can delay your recovery.  · Do not avoid exercise or work. Your body is made to move. It is not dangerous to be active, even though your back may hurt. Your back will likely heal faster if you return to being active before your pain is gone.  · Pay attention to your body when you  bend and lift. Many people have less discomfort when lifting if they bend their knees, keep the load close to their bodies, and  avoid twisting. Often, the most comfortable positions are those that put less stress on your recovering back.  · Find a comfortable position to sleep. Use a firm mattress and lie on your side with your knees slightly bent. If you lie on your back, put a pillow under your knees.  · Only take over-the-counter or prescription medicines as directed by your caregiver. Over-the-counter medicines to reduce pain and inflammation are often the most helpful. Your caregiver may prescribe muscle relaxant drugs. These medicines help dull your pain so you can more quickly return to your normal activities and healthy exercise.  · Put ice on the injured area.  · Put ice in a plastic bag.  · Place a towel between your skin and the bag.  · Leave the ice on for 15-20 minutes, 3-4 times a day for the first 2 to 3 days. After that, ice and heat may be alternated to reduce pain and spasms.  · Ask your caregiver about trying back exercises and gentle massage. This may be of some benefit.  · Avoid feeling anxious or stressed. Stress increases muscle tension and can worsen back pain. It is important to recognize when you are anxious or stressed and learn ways to manage it. Exercise is a great option.  SEEK MEDICAL CARE IF:  · You have pain that is not relieved with rest or   medicine.  · You have pain that does not improve in 1 week.  · You have new symptoms.  · You are generally not feeling well.  SEEK IMMEDIATE MEDICAL CARE IF:   · You have pain that radiates from your back into your legs.  · You develop new bowel or bladder control problems.  · You have unusual weakness or numbness in your arms or legs.  · You develop nausea or vomiting.  · You develop abdominal pain.  · You feel faint.  Document Released: 09/03/2005 Document Revised: 03/04/2012 Document Reviewed: 01/22/2011  Exit[employer]® Patient Information ©2014 [employer].

## 2013-06-01 NOTE — Progress Notes (Signed)
Patient ID: Joyce Garcia, female   DOB: 09/08/1976, 37 y.o.   MRN: [id number]  CC: New patient  HPI: 37 year old female with no significant past medical history who presented to clinic for establishing new primary care provider. Patient has no current medications. She reports chronic back pain, 8/10 in intensity in upper and lower back. Pain does not radiate. Patient takes Advil on and off which provided some pain relief. No fever or chills. No gait instability. No joint swelling or erythema. No chest pain or shortness of breath.  No Known Allergies Past Medical History  Diagnosis Date  . Abnormal Pap smear     at [facility] prior to becoming pregnant, unsure of what was abnormal  . History of breast surgery     removed a lump   Current Outpatient Prescriptions on File Prior to Visit  Medication Sig Dispense Refill  . ibuprofen (ADVIL,MOTRIN) 200 MG tablet Take 200 mg by mouth every 6 (six) hours as needed. For pain        No current facility-administered medications on file prior to visit.   Family History  Problem Relation Age of Onset  . Hypertension Father   . Hypertension Sister   . Hypertension Sister   . Hypertension Sister    History   Social History  . Marital Status: Single    Spouse Name: N/A    Number of Children: N/A  . Years of Education: N/A   Occupational History  . Not on file.   Social History Main Topics  . Smoking status: Never Smoker   . Smokeless tobacco: Never Used  . Alcohol Use: No  . Drug Use: No  . Sexual Activity: Yes    Birth Control/ Protection: None   Other Topics Concern  . Not on file   Social History Narrative   With boyfriend x 10 years.  Has 4 children.  2 from this marriage and 2 from previous marriage. [place]- came to [place] in [place].     Review of Systems  Constitutional: Negative for fever, chills, diaphoresis, activity change, appetite change and fatigue.  HENT: Negative for ear pain, nosebleeds, congestion, facial  swelling, rhinorrhea, neck pain, neck stiffness and ear discharge.   Eyes: Negative for pain, discharge, redness, itching and visual disturbance.  Respiratory: Negative for cough, choking, chest tightness, shortness of breath, wheezing and stridor.   Cardiovascular: Negative for chest pain, palpitations and leg swelling.  Gastrointestinal: Negative for abdominal distention.  Genitourinary: Negative for dysuria, urgency, frequency, hematuria, flank pain, decreased urine volume, difficulty urinating and dyspareunia.  Musculoskeletal: Positive for back pain, negative joint swelling, arthralgias and gait problem.  Neurological: Negative for dizziness, tremors, seizures, syncope, facial asymmetry, speech difficulty, weakness, light-headedness, numbness and headaches.  Hematological: Negative for adenopathy. Does not bruise/bleed easily.  Psychiatric/Behavioral: Negative for hallucinations, behavioral problems, confusion, dysphoric mood, decreased concentration and agitation.    Objective:   Filed Vitals:   06/01/13 1209  BP: 129/76  Pulse: 73  Temp:   Resp:     Physical Exam  Constitutional: Appears well-developed and well-nourished. No distress.  HENT: Normocephalic. External right and left ear normal. Oropharynx is clear and moist.  Eyes: Conjunctivae and EOM are normal. PERRLA, no scleral icterus.  Neck: Normal ROM. Neck supple. No JVD. No tracheal deviation. No thyromegaly.  CVS: RRR, S1/S2 +, no murmurs, no gallops, no carotid bruit.  Pulmonary: Effort and breath sounds normal, no stridor, rhonchi, wheezes, rales.  Abdominal: Soft. BS +,  no distension,  tenderness, rebound or guarding.  Musculoskeletal: Normal range of motion. No edema and no tenderness.  Lymphadenopathy: No lymphadenopathy noted, cervical, inguinal. Neuro: Alert. Normal reflexes, muscle tone coordination. No cranial nerve deficit. Skin: Skin is warm and dry. No rash noted. Not diaphoretic. No erythema. No pallor.   Psychiatric: Normal mood and affect. Behavior, judgment, thought content normal.   Lab Results  Component Value Date   WBC 7.2 09/13/2011   HGB 14.6 09/13/2011   HCT 43.2 09/13/2011   MCV 98.4 09/13/2011   PLT 223 09/13/2011   Lab Results  Component Value Date   CREATININE 0.96 09/13/2011   BUN 15 09/13/2011   NA 138 09/13/2011   K 3.8 09/13/2011   CL 103 09/13/2011   CO2 26 09/13/2011    No results found for this basename: HGBA1C   Lipid Panel  No results found for this basename: chol, trig, hdl, cholhdl, vldl, ldlcalc       Assessment and plan:   Patient Active Problem List   Diagnosis Date Noted  . Low back pain 09/03/2012    Priority: Medium - Obtain x-ray of the lumbar and thoracic spine       preventative healthcare  - Referral to GYN provider  - Check A1c, lipid panel, TSH, CBC and CMP

## 2013-06-01 NOTE — Progress Notes (Signed)
Pt is here to establish care. Pt reports of having lumbar back pain. Pain scale is a 6, hot and tightness, pain has been there for 3 years. recently its gotten worse. She also said that the bottom of her feet hurt.

## 2013-06-02 LAB — TSH: TSH: 0.849 u[IU]/mL (ref 0.350–4.500)

## 2013-06-03 ENCOUNTER — Encounter: Payer: Self-pay | Admitting: Family Medicine

## 2013-06-17 ENCOUNTER — Encounter: Payer: Self-pay | Admitting: Family Medicine

## 2013-07-02 ENCOUNTER — Ambulatory Visit: Payer: No Typology Code available for payment source

## 2013-07-08 IMAGING — US US OB FOLLOW-UP
1 series · 12 of 28 positions shown · non-contrast
Comparison: none

[Series 1: us ob follow up · 61 acquisitions, 12 frames shown]
[im 3/61]
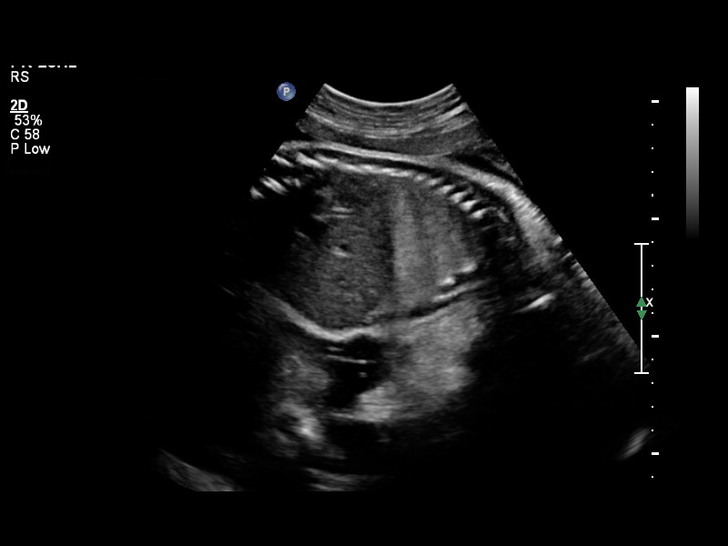
[im 7/61]
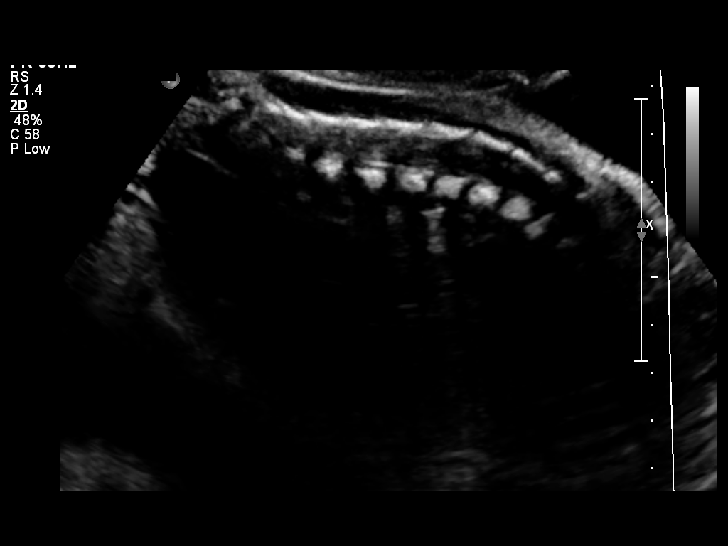
[im 12/61]
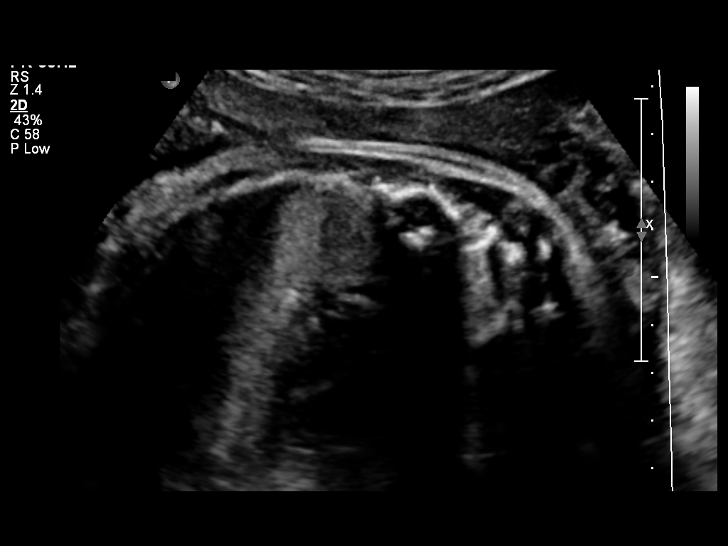
[im 18/61]
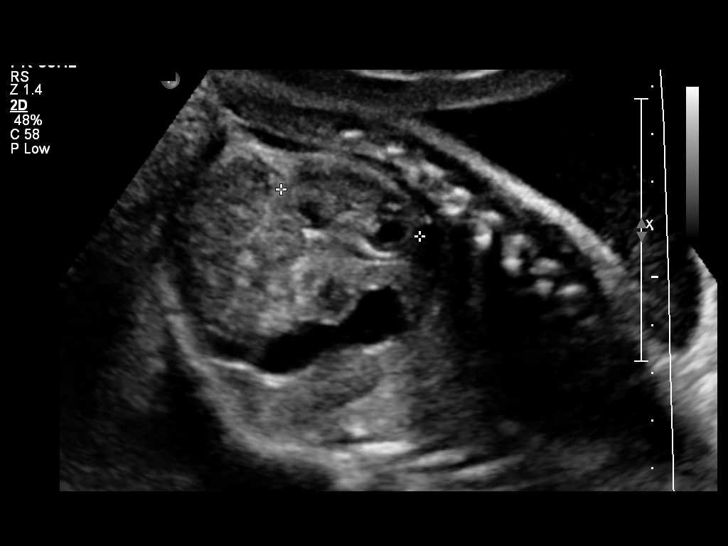
[im 23/61]
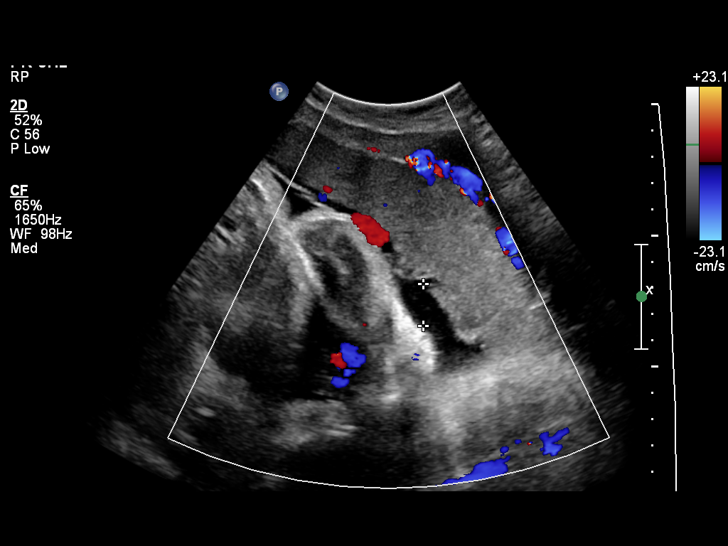
[im 27/61]
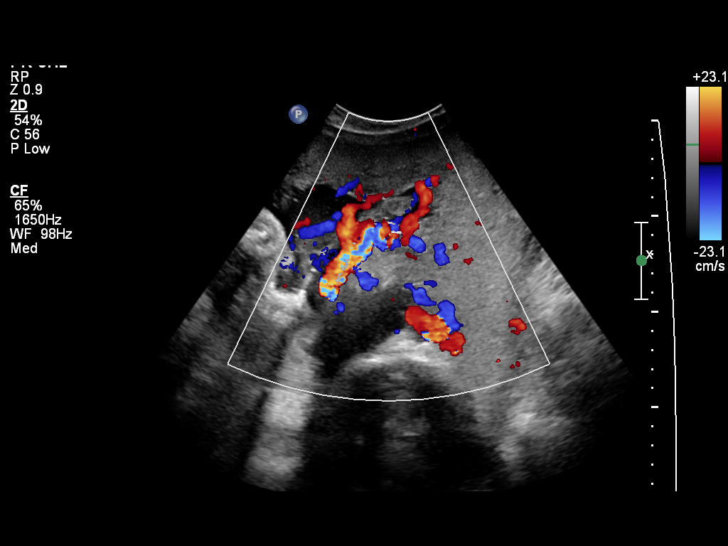
[im 34/61]
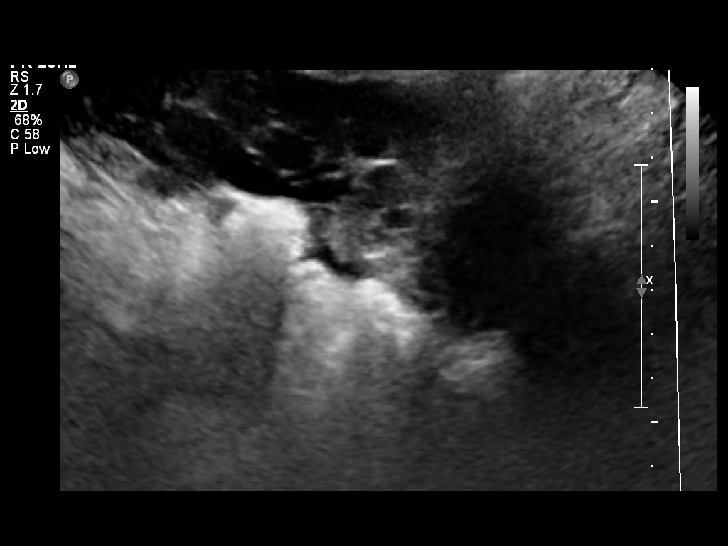
[im 38/61]
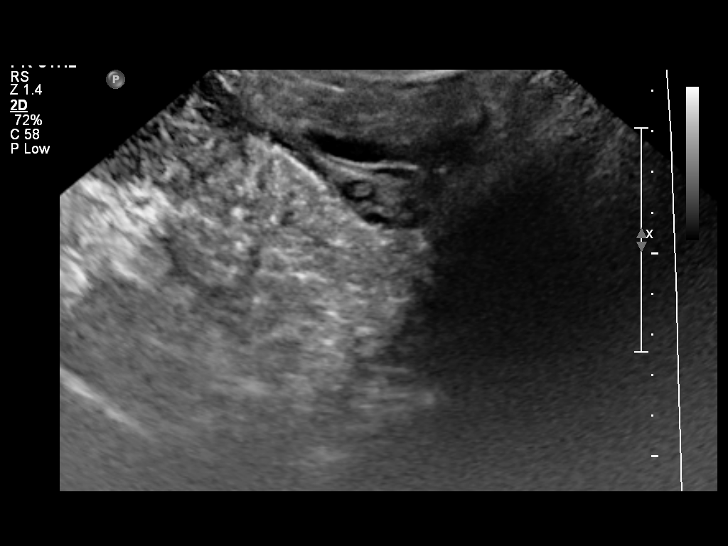
[im 43/61]
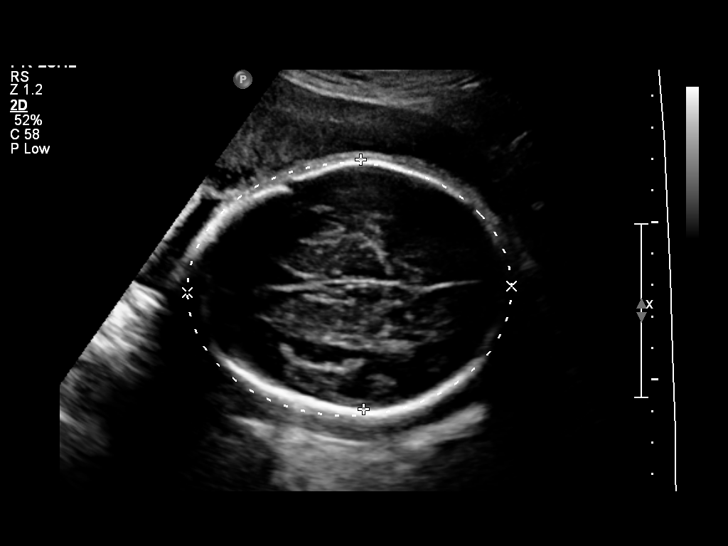
[im 49/61]
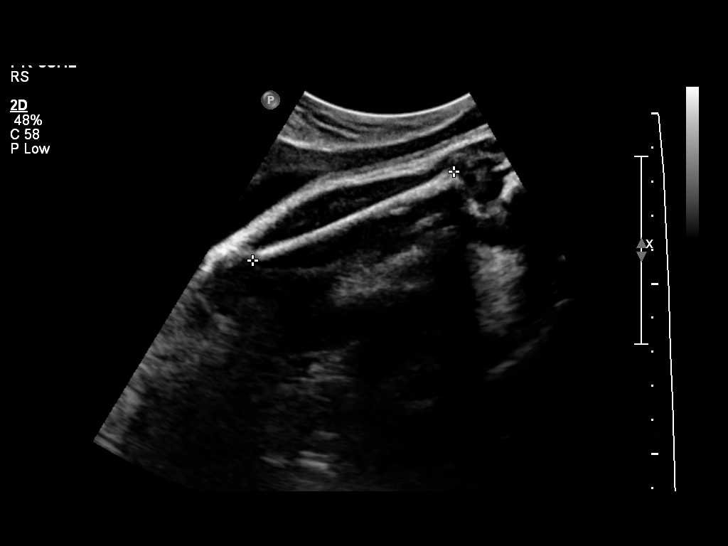
[im 54/61]
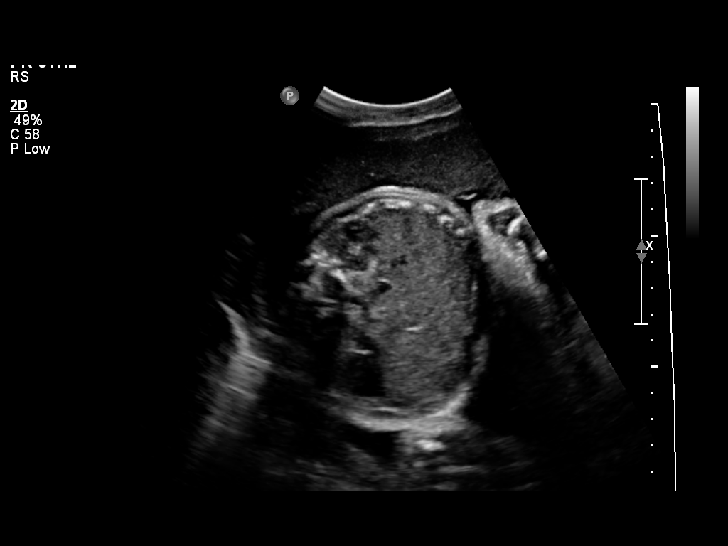
[im 58/61]
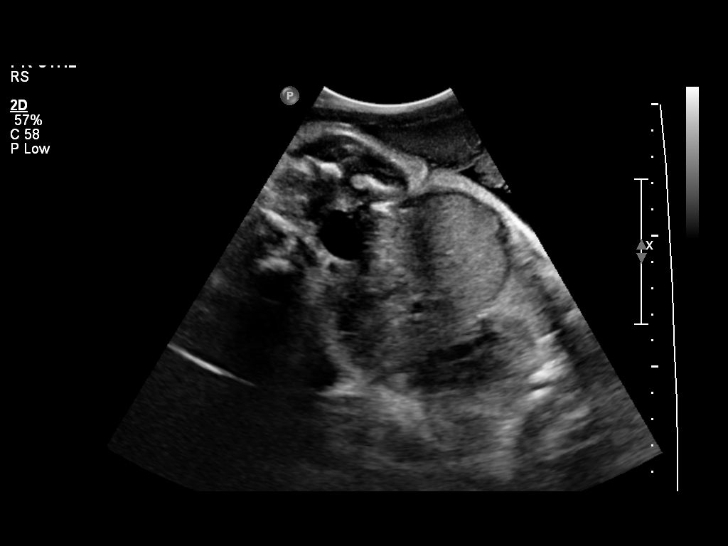

[12 of 28 positions shown; findings below may reference images not displayed]

OBSTETRICS REPORT
                      (Signed Final 06/25/2011 [DATE])

           JHEMBOY

 Order#:         [id number]
Procedures

 US OB FOLLOW UP                                       76816.1
Indications

 Follow-up fetal anatomic evaluation
 Poor obstetric history-Recurrent (habitual) abortion
 (3 consecutive ab's)
Fetal Evaluation

 Fetal Heart Rate:  149                          bpm
 Cardiac Activity:  Observed
 Presentation:      Cephalic
 Placenta:          Anterior, above cervical os
 P. Cord            Visualized
 Insertion:

 Amniotic Fluid
 AFI FV:      Subjectively within normal limits
 AFI Sum:     14.31   cm       50  %Tile     Larg Pckt:    4.88  cm
 RUQ:   4.88    cm   RLQ:    3.85   cm    LUQ:   1.59    cm   LLQ:    3.99   cm
Biometry

 BPD:     78.3  mm     G. Age:  31w 3d                CI:        72.33   70 - 86
                                                      FL/HC:      21.9   19.9 -

 HC:     292.9  mm     G. Age:  32w 2d        7  %    HC/AC:      0.97   0.96 -

 AC:     300.7  mm     G. Age:  34w 0d       79  %    FL/BPD:     81.7   71 - 87
 FL:        64  mm     G. Age:  33w 1d       40  %    FL/AC:      21.3   20 - 24

 Est. FW:    6748  gm    4 lb 13 oz      65  %
Gestational Age

 LMP:           33w 0d        Date:  11/06/10                 EDD:   08/13/11
 U/S Today:     32w 5d                                        EDD:   08/15/11
 Best:          33w 0d     Det. By:  LMP  (11/06/10)          EDD:   08/13/11
Anatomy
 Cranium:           Appears normal      Aortic Arch:       Previously seen
 Fetal Cavum:       Appears normal      Ductal Arch:       Previously seen
 Ventricles:        Appears normal      Diaphragm:         Appears normal
 Choroid Plexus:    Previously seen     Stomach:           Appears
                                                           normal, left
                                                           sided
 Cerebellum:        Previously seen     Abdomen:           Previously seen
 Posterior Fossa:   Previously seen     Abdominal Wall:    Previously seen
 Nuchal Fold:       Not applicable      Cord Vessels:      Previously seen
                    (>20 wks GA)
 Face:              Previously seen     Kidneys:           Appear normal
 Heart:             Appears normal      Bladder:           Appears normal
                    (4 chamber &
                    axis)
 RVOT:              Basic anatomy       Spine:             Appears normal
                    exam per order
 LVOT:              Previously seen     Limbs:             Previously seen

 Other:     Female gender prev visualized. Technically difficult due
            to advanced GA and fetal position.
Cervix Uterus Adnexa

 Cervical Length:    3.35     cm

 Cervix:       Normal appearance by transabdominal scan.
 Left Ovary:    Not visualized.
 Right Ovary:   Within normal limits.
 Adnexa:     No abnormality visualized.
Impression

 Single live IUP in cephalic presentation.  Concordant
 measurements/assigned GA by LMP.
 Spine appears normal today. No new visualized anatomic
 abnormality.

 [facility] with us.  Please do not hesitate to contact

## 2013-07-31 ENCOUNTER — Encounter: Payer: Self-pay | Admitting: Family Medicine

## 2013-07-31 ENCOUNTER — Ambulatory Visit (INDEPENDENT_AMBULATORY_CARE_PROVIDER_SITE_OTHER): Payer: No Typology Code available for payment source | Admitting: Family Medicine

## 2013-07-31 VITALS — BP 140/86 | HR 85 | Temp 98.2°F | Ht 60.0 in | Wt 151.4 lb

## 2013-07-31 DIAGNOSIS — N871 Moderate cervical dysplasia: Secondary | ICD-10-CM

## 2013-07-31 DIAGNOSIS — Z01419 Encounter for gynecological examination (general) (routine) without abnormal findings: Secondary | ICD-10-CM

## 2013-07-31 NOTE — Progress Notes (Signed)
Subjective:     HENYA AGUALLO is a 37 y.o. female and is here for a comprehensive physical exam. The patient reports no complaints. 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 Reports scraping of cervix, but unable to find report of tissue. ?normal pap smear in 2012 but illegible scan for date.  History   Social History  . Marital Status: Single    Spouse Name: N/A    Number of Children: N/A  . Years of Education: N/A   Occupational History  . Not on file.   Social History Main Topics  . Smoking status: Never Smoker   . Smokeless tobacco: Never Used  . Alcohol Use: No  . Drug Use: No  . Sexual Activity: Yes    Birth Control/ Protection: None   Other Topics Concern  . Not on file   Social History Narrative   With boyfriend x 10 years.  Has 4 children.  2 from this marriage and 2 from previous marriage. [place] came to [place] in [place].    Health Maintenance  Topic Date Due  . Pap Smear  03/14/1992  . Influenza Vaccine  04/17/2013  . Tetanus/tdap  07/17/2021    The following portions of the patient's history were reviewed and updated as appropriate: allergies, current medications, past family history, past medical history, past social history, past surgical history and problem list.  Review of Systems A comprehensive review of systems was negative.   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 Unable to find report of LEEP but ?neg repeat pap 2012 and pt report  Objective:    General appearance: alert, cooperative, appears stated age and no distress Head: Normocephalic, without obvious abnormality, atraumatic Lungs: clear to auscultation bilaterally and normal percussion bilaterally Breasts: normal appearance, no masses or tenderness, pt declined after discussion Heart: regular rate and rhythm, S1, S2 normal, no murmur, click, rub or gallop and normal apical impulse Abdomen: soft, non-tender;  bowel sounds normal; no masses,  no organomegaly Pelvic: cervix normal in appearance, external genitalia normal, no adnexal masses or tenderness, no cervical motion tenderness, rectovaginal septum normal, uterus normal size, shape, and consistency and vagina normal without discharge Extremities: extremities normal, atraumatic, no cyanosis or edema    Assessment:    Healthy female exam. Pap smear repeated today      Plan:  Send Pap w/ HPV, and gc/c per pt request. Pt w/ hx of CIN II. Will send for HPV as well. Continue active life style Annual visit Pt requests removal of nexplanon. Recommended return to [facility] that placed the nexplanon.  Declines flu shot    See After Visit Summary for Counseling Recommendations

## 2013-07-31 NOTE — Patient Instructions (Signed)
Exercise to Stay Healthy Exercise helps you become and stay healthy. EXERCISE IDEAS AND TIPS Choose exercises that:  You enjoy.  Fit into your day. You do not need to exercise really hard to be healthy. You can do exercises at a slow or medium level and stay healthy. You can:  Stretch before and after working out.  Try yoga, Pilates, or tai chi.  Lift weights.  Walk fast, swim, jog, run, climb stairs, bicycle, dance, or rollerskate.  Take aerobic classes. Exercises that burn about 150 calories:  Running 1  miles in 15 minutes.  Playing volleyball for 45 to 60 minutes.  Washing and waxing a car for 45 to 60 minutes.  Playing touch football for 45 minutes.  Walking 1  miles in 35 minutes.  Pushing a stroller 1  miles in 30 minutes.  Playing basketball for 30 minutes.  Raking leaves for 30 minutes.  Bicycling 5 miles in 30 minutes.  Walking 2 miles in 30 minutes.  Dancing for 30 minutes.  Shoveling snow for 15 minutes.  Swimming laps for 20 minutes.  Walking up stairs for 15 minutes.  Bicycling 4 miles in 15 minutes.  Gardening for 30 to 45 minutes.  Jumping rope for 15 minutes.  Washing windows or floors for 45 to 60 minutes. Document Released: 10/06/2010 Document Revised: 11/26/2011 Document Reviewed: 10/06/2010 [employer] Patient Information 2014 [employer].  

## 2013-08-10 ENCOUNTER — Encounter: Payer: Self-pay | Admitting: Family Medicine

## 2013-08-10 ENCOUNTER — Telehealth: Payer: Self-pay | Admitting: *Deleted

## 2013-08-10 NOTE — Telephone Encounter (Signed)
Called home number , was told I had wrong number. Called mobile number and left a message we are calling, please call clinic.

## 2013-08-10 NOTE — Telephone Encounter (Signed)
Message copied by Gerome Apley on Mon Aug 10, 2013  2:30 PM ------      Message from: Jolyn Lent R      Created: Mon Aug 10, 2013 10:01 AM       Negative pap smear      Off normal screening guidelines. Reassuring with 2 negative tests, Recommend repeat cotesting in 3 years, if normal move to q5 screeening.            Thanks for the help.      Cheers,      Hinda Lenis ------

## 2013-08-11 NOTE — Telephone Encounter (Signed)
Called patient, no answer- left message stating we are trying to get in touch with you with some information, please call us back at the clinics.  

## 2013-08-11 NOTE — Telephone Encounter (Signed)
Patient called and left message stating she has received two calls from [place] and is calling us back. Called patient and informed her of results and recommendations. Patient verbalized understanding and had no further questions

## 2013-09-29 ENCOUNTER — Ambulatory Visit: Payer: No Typology Code available for payment source | Attending: Internal Medicine

## 2013-12-10 ENCOUNTER — Ambulatory Visit: Payer: No Typology Code available for payment source | Attending: Internal Medicine | Admitting: Family Medicine

## 2013-12-10 ENCOUNTER — Encounter: Payer: Self-pay | Admitting: Family Medicine

## 2013-12-10 VITALS — BP 130/78 | HR 74 | Temp 98.5°F | Resp 20 | Ht 65.0 in | Wt 150.2 lb

## 2013-12-10 DIAGNOSIS — M549 Dorsalgia, unspecified: Secondary | ICD-10-CM | POA: Insufficient documentation

## 2013-12-10 DIAGNOSIS — K089 Disorder of teeth and supporting structures, unspecified: Secondary | ICD-10-CM

## 2013-12-10 DIAGNOSIS — K0889 Other specified disorders of teeth and supporting structures: Secondary | ICD-10-CM

## 2013-12-10 DIAGNOSIS — Z3046 Encounter for surveillance of implantable subdermal contraceptive: Secondary | ICD-10-CM

## 2013-12-10 DIAGNOSIS — Z4689 Encounter for fitting and adjustment of other specified devices: Secondary | ICD-10-CM | POA: Insufficient documentation

## 2013-12-10 NOTE — Patient Instructions (Signed)
Contraception Choices Contraception (birth control) is the use of any methods or devices to prevent pregnancy. Below are some methods to help avoid pregnancy. HORMONAL METHODS   Contraceptive implant This is a thin, plastic tube containing progesterone hormone. It does not contain estrogen hormone. Your health care provider inserts the tube in the inner part of the upper arm. The tube can remain in place for up to 3 years. After 3 years, the implant must be removed. The implant prevents the ovaries from releasing an egg (ovulation), thickens the cervical mucus to prevent sperm from entering the uterus, and thins the lining of the inside of the uterus.  Progesterone-only injections These injections are given every 3 months by your health care provider to prevent pregnancy. This synthetic progesterone hormone stops the ovaries from releasing eggs. It also thickens cervical mucus and changes the uterine lining. This makes it harder for sperm to survive in the uterus.  Birth control pills These pills contain estrogen and progesterone hormone. They work by preventing the ovaries from releasing eggs (ovulation). They also cause the cervical mucus to thicken, preventing the sperm from entering the uterus. Birth control pills are prescribed by a health care provider.Birth control pills can also be used to treat heavy periods.  Minipill This type of birth control pill contains only the progesterone hormone. They are taken every day of each month and must be prescribed by your health care provider.  Birth control patch The patch contains hormones similar to those in birth control pills. It must be changed once a week and is prescribed by a health care provider.  Vaginal ring The ring contains hormones similar to those in birth control pills. It is left in the vagina for 3 weeks, removed for 1 week, and then a new one is put back in place. The patient must be comfortable inserting and removing the ring from the  vagina.A health care provider's prescription is necessary.  Emergency contraception Emergency contraceptives prevent pregnancy after unprotected sexual intercourse. This pill can be taken right after sex or up to 5 days after unprotected sex. It is most effective the sooner you take the pills after having sexual intercourse. Most emergency contraceptive pills are available without a prescription. Check with your pharmacist. Do not use emergency contraception as your only form of birth control. BARRIER METHODS   Female condom This is a thin sheath (latex or rubber) that is worn over the penis during sexual intercourse. It can be used with spermicide to increase effectiveness.  Female condom. This is a soft, loose-fitting sheath that is put into the vagina before sexual intercourse.  Diaphragm This is a soft, latex, dome-shaped barrier that must be fitted by a health care provider. It is inserted into the vagina, along with a spermicidal jelly. It is inserted before intercourse. The diaphragm should be left in the vagina for 6 to 8 hours after intercourse.  Cervical cap This is a round, soft, latex or plastic cup that fits over the cervix and must be fitted by a health care provider. The cap can be left in place for up to 48 hours after intercourse.  Sponge This is a soft, circular piece of polyurethane foam. The sponge has spermicide in it. It is inserted into the vagina after wetting it and before sexual intercourse.  Spermicides These are chemicals that kill or block sperm from entering the cervix and uterus. They come in the form of creams, jellies, suppositories, foam, or tablets. They do not require a   prescription. They are inserted into the vagina with an applicator before having sexual intercourse. The process must be repeated every time you have sexual intercourse. INTRAUTERINE CONTRACEPTION  Intrauterine device (IUD) This is a T-shaped device that is put in a woman's uterus during a  menstrual period to prevent pregnancy. There are 2 types:  Copper IUD This type of IUD is wrapped in copper wire and is placed inside the uterus. Copper makes the uterus and fallopian tubes produce a fluid that kills sperm. It can stay in place for 10 years.  Hormone IUD This type of IUD contains the hormone progestin (synthetic progesterone). The hormone thickens the cervical mucus and prevents sperm from entering the uterus, and it also thins the uterine lining to prevent implantation of a fertilized egg. The hormone can weaken or kill the sperm that get into the uterus. It can stay in place for 3 5 years, depending on which type of IUD is used. PERMANENT METHODS OF CONTRACEPTION  Female tubal ligation This is when the woman's fallopian tubes are surgically sealed, tied, or blocked to prevent the egg from traveling to the uterus.  Hysteroscopic sterilization This involves placing a small coil or insert into each fallopian tube. Your doctor uses a technique called hysteroscopy to do the procedure. The device causes scar tissue to form. This results in permanent blockage of the fallopian tubes, so the sperm cannot fertilize the egg. It takes about 3 months after the procedure for the tubes to become blocked. You must use another form of birth control for these 3 months.  Female sterilization This is when the female has the tubes that carry sperm tied off (vasectomy).This blocks sperm from entering the vagina during sexual intercourse. After the procedure, the man can still ejaculate fluid (semen). NATURAL PLANNING METHODS  Natural family planning This is not having sexual intercourse or using a barrier method (condom, diaphragm, cervical cap) on days the woman could become pregnant.  Calendar method This is keeping track of the length of each menstrual cycle and identifying when you are fertile.  Ovulation method This is avoiding sexual intercourse during ovulation.  Symptothermal method This is  avoiding sexual intercourse during ovulation, using a thermometer and ovulation symptoms.  Post ovulation method This is timing sexual intercourse after you have ovulated. Regardless of which type or method of contraception you choose, it is important that you use condoms to protect against the transmission of sexually transmitted infections (STIs). Talk with your health care provider about which form of contraception is most appropriate for you. Document Released: 09/03/2005 Document Revised: 05/06/2013 Document Reviewed: 02/26/2013 Exit[employer] Patient Information 2014 [employer].  

## 2013-12-10 NOTE — Progress Notes (Signed)
Patient here to establish care for back-pain. Pain 8/10. Patient having lower back pain that is worse after normal daily activities.  Patient has tried lower back exercises as well as ibuprofen but is not having adequate pain control. Patient also experiencing right-sided tooth pain. Requests dental consult. Here for Implanon removal. Last menstrual period one year ago.

## 2013-12-10 NOTE — Progress Notes (Signed)
   Subjective:    Patient ID: Joyce Garcia, female    DOB: 08/08/1976, 38 y.o.   MRN: [id number]  HPI  Patient is here requesting implant on normal flow. It was placed in her left arm 2 years ago. Since then she has unfortunately had headaches and back pain which she attributes to the Implanon. She does not want other children and plans to use condoms as birth control.  Patient also has dental pain from teeth and requested dental referral.  Review of Systems A 12 point review of systems is negative except as per hpi.       Objective:   Physical Exam  Mouth - moist, front teeth on right show decay.  Left arm - implanon easily palpated. neurovasc intact.       Assessment & Plan:  Joyce Garcia was seen today for establish care and back pain.  Diagnoses and associated orders for this visit:  Pain, dental Referred to dentist Encounter for Implanon removal Removed successfully  F/u annually or as needed before.   Procedure note:  Patient was placed in the prone position with her left arm over head. The Implanon was easily palpated in the appropriate location. Area was swabbed x3 with Betadine. 1/2-1 cc of 2% lidocaine with epi was injected underneath the tip (1% lidocaine was not available) incision approximately 3 mm long parralell to the tip was made and the tip was pushed toward the incision. The tip was easily seen but not easily advanced. Another 2 mm cut. I was able to grab the Implanon well but some mild dissection of tissue that was adhered to the implant was necessary. After dissecting out about 2 mm the Implanon slid out easily. Patient tolerated the procedure very well. Betadine was cleaned off and the patient was given a clean sterile dressing. She was given the usual wound care instructions. We did review that it is possible to get pregnant as early as next week so she needs to use alternate contraception immediately. She will call with any questions or concerns. She  declined any prescription birth control this time.

## 2014-05-31 ENCOUNTER — Ambulatory Visit: Payer: Self-pay | Attending: Internal Medicine

## 2014-07-19 ENCOUNTER — Encounter: Payer: Self-pay | Admitting: Family Medicine

## 2014-12-06 ENCOUNTER — Ambulatory Visit: Payer: Self-pay | Attending: Internal Medicine

## 2014-12-13 ENCOUNTER — Encounter: Payer: Self-pay | Admitting: Family Medicine

## 2014-12-20 ENCOUNTER — Ambulatory Visit: Payer: Self-pay | Attending: Family Medicine | Admitting: Family Medicine

## 2014-12-20 ENCOUNTER — Encounter: Payer: Self-pay | Admitting: Family Medicine

## 2014-12-20 VITALS — BP 123/81 | HR 78 | Temp 98.8°F | Resp 16 | Ht 65.0 in | Wt 145.0 lb

## 2014-12-20 DIAGNOSIS — H547 Unspecified visual loss: Secondary | ICD-10-CM | POA: Insufficient documentation

## 2014-12-20 DIAGNOSIS — K219 Gastro-esophageal reflux disease without esophagitis: Secondary | ICD-10-CM | POA: Insufficient documentation

## 2014-12-20 DIAGNOSIS — M545 Low back pain: Secondary | ICD-10-CM | POA: Insufficient documentation

## 2014-12-20 DIAGNOSIS — K0389 Other specified diseases of hard tissues of teeth: Secondary | ICD-10-CM | POA: Insufficient documentation

## 2014-12-20 DIAGNOSIS — J302 Other seasonal allergic rhinitis: Secondary | ICD-10-CM | POA: Insufficient documentation

## 2014-12-20 DIAGNOSIS — Z01419 Encounter for gynecological examination (general) (routine) without abnormal findings: Secondary | ICD-10-CM | POA: Insufficient documentation

## 2014-12-20 MED ORDER — RANITIDINE HCL 150 MG PO TABS: 150.0000 mg | ORAL_TABLET | Freq: Every day | ORAL | Status: DC

## 2014-12-20 MED ORDER — CETIRIZINE HCL 10 MG PO TABS: 10.0000 mg | ORAL_TABLET | Freq: Every day | ORAL | Status: DC

## 2014-12-20 NOTE — Assessment & Plan Note (Signed)
-  heartburn- daily zantac. GERD diet.

## 2014-12-20 NOTE — Progress Notes (Signed)
   Subjective:    Patient ID: Joyce Garcia, female    DOB: [birth year], 39 y.o.   MRN: [id number] CC; wellness physical, requesting pap smear  HPI  1. Pap smear: last pap in 07/2013. Normal.   2. Low back pain: x 5 years. Worse after birth of last child.   Soc Hx: non smoker  Review of Systems  General:  Negative for unexplained weight loss, fever Skin: Negative for new or changing mole, sore that won't heal HEENT: Positive for dental sensitivity and trouble seeing.  Negative for trouble hearing, ringing in ears, mouth sores, hoarseness, change in voice, dysphagia. CV:  Negative for chest pain, dyspnea, edema, palpitations Resp: Negative for cough, dyspnea, hemoptysis GI: Positive for acid reflux. Negative for nausea, vomiting, diarrhea, constipation, abdominal pain, melena, hematochezia. GU: Positive for urinary frequency. Positive for decrease blood flow with periods and shorter interval from 28 days to 23-25 days.  Negative for dysuria, incontinence, urinary hesitance, hematuria, vaginal or penile discharge, polyuria, sexual difficulty, lumps inbreasts MSK: Positive for low back pain. Negative for muscle cramps or aches, joint pain or swelling Neuro: Negative for headaches, weakness, numbness, dizziness, passing out/fainting Psych: Negative for depression, anxiety, memory problems     Objective:   Physical Exam BP 123/81 mmHg  Pulse 78  Temp(Src) 98.8 F (37.1 C) (Oral)  Resp 16  Ht 5\' 5"  (1.651 m)  Wt 145 lb (65.772 kg)  BMI 24.13 kg/m2  SpO2 96%  LMP 11/29/2014  General Appearance:    Alert, cooperative, no distress, appears stated age  Head:    Normocephalic, without obvious abnormality, atraumatic  Eyes:    PERRL, conjunctiva/corneas clear, EOM's intact, fundi    benign, both eyes  Ears:    Normal TM's and external ear canals, both ears  Nose:   Nares normal, septum midline, mucosa normal, no drainage    or sinus tenderness  Throat:   Lips, mucosa, and  tongue normal; teeth and gums normal  Neck:   Supple, symmetrical, trachea midline, no adenopathy;    thyroid:  no enlargement/tenderness/nodules; no carotid   bruit or JVD  Back:     Symmetric, no curvature, ROM normal, no CVA tenderness  Lungs:     Clear to auscultation bilaterally, respirations unlabored  Chest Wall:    No tenderness or deformity   Heart:    Regular rate and rhythm, S1 and S2 normal, no murmur, rub   or gallop  Breast Exam:    No tenderness, masses, or nipple abnormality  Abdomen:     Soft, non-tender, bowel sounds active all four quadrants,    no masses, no organomegaly  Genitalia:    Normal female without lesion, discharge or tenderness  Rectal:    Deferred   Extremities:   Extremities normal, atraumatic, no cyanosis or edema  Pulses:   2+ and symmetric all extremities  Skin:   Skin color, texture, turgor normal, no rashes or lesions  Lymph nodes:   Cervical, supraclavicular, and axillary nodes normal  Neurologic:   CNII-XII intact, normal strength, sensation and reflexes    throughout         Assessment & Plan:

## 2014-12-20 NOTE — Assessment & Plan Note (Signed)
decreased vision- screened vision today, optometry referral, list of low cost eye doctors (see below)

## 2014-12-20 NOTE — Assessment & Plan Note (Signed)
-  allergies- daily zyrtec

## 2014-12-20 NOTE — Assessment & Plan Note (Addendum)
tooth sensitivity- try sensodyne tooth paste, dental referral, list of low cost dentist

## 2014-12-20 NOTE — Patient Instructions (Addendum)
Mrs. Finck,  Thank you for coming in today. It was a pleasure meeting you. I look forward to being your primary doctor.  1. Physical done today. We  Discussed the following:  -decreased vision- screened vision today, optometry referral, list of low cost eye doctors (see below)  -tooth sensitivity- try sensodyne tooth paste, dental referral, list of low cost dentist  -allergies- daily zyrtec  -heartburn- daily zantac  -change in periods- this is normal overtime with age, normal genital exam today  -pregnancy- I recommend not getting pregnant until we have fully evaluated your back pain. I am concerned that pregnancy would significantly worsen your back pain and bring back your hip pain. With each year, risk of complications with pregnancy go up.    Low out cost optometrist (about $65.00 for office visit) 1. Dr. Thurston Hole Phone # [phone] [address]  [place], [place] [address]   2. [facility]  Phone # [phone] [address], [place] [address]   F/u in 3-4 weeks to discuss your back pain in more detail  Dr. Adrian Blackwater   Food Choices for Gastroesophageal Reflux Disease When you have gastroesophageal reflux disease (GERD), the foods you eat and your eating habits are very important. Choosing the right foods can help ease the discomfort of GERD. WHAT GENERAL GUIDELINES DO I NEED TO FOLLOW?  Choose fruits, vegetables, whole grains, low-fat dairy products, and low-fat meat, fish, and poultry.  Limit fats such as oils, salad dressings, butter, nuts, and avocado.  Keep a food diary to identify foods that cause symptoms.  Avoid foods that cause reflux. These may be different for different people.  Eat frequent small meals instead of three large meals each day.  Eat your meals slowly, in a relaxed setting.  Limit fried foods.  Cook foods using methods other than frying.  Avoid drinking alcohol.  Avoid drinking large amounts of  liquids with your meals.  Avoid bending over or lying down until 2-3 hours after eating. WHAT FOODS ARE NOT RECOMMENDED? The following are some foods and drinks that may worsen your symptoms: Vegetables Tomatoes. Tomato juice. Tomato and spaghetti sauce. Chili peppers. Onion and garlic. Horseradish. Fruits Oranges, grapefruit, and lemon (fruit and juice). Meats High-fat meats, fish, and poultry. This includes hot dogs, ribs, ham, sausage, salami, and bacon. Dairy Whole milk and chocolate milk. Sour cream. Cream. Butter. Ice cream. Cream cheese.  Beverages Coffee and tea, with or without caffeine. Carbonated beverages or energy drinks. Condiments Hot sauce. Barbecue sauce.  Sweets/Desserts Chocolate and cocoa. Donuts. Peppermint and spearmint. Fats and Oils High-fat foods, including [place] fries and potato chips. Other Vinegar. Strong spices, such as black pepper, white pepper, red pepper, cayenne, curry powder, cloves, ginger, and chili powder. The items listed above may not be a complete list of foods and beverages to avoid. Contact your dietitian for more information. Document Released: 09/03/2005 Document Revised: 09/08/2013 Document Reviewed: 07/08/2013 [facility] Patient Information 2015 [place], [place]. This information is not intended to replace advice given to you by your health care provider. Make sure you discuss any questions you have with your health care provider.

## 2014-12-20 NOTE — Progress Notes (Signed)
Establish Care Annual physical and pap Complaining of back pain x3 years  No Hx injury

## 2015-01-17 ENCOUNTER — Ambulatory Visit: Payer: Self-pay | Attending: Family Medicine

## 2015-05-29 ENCOUNTER — Encounter (HOSPITAL_COMMUNITY): Payer: Self-pay | Admitting: Emergency Medicine

## 2015-05-29 ENCOUNTER — Emergency Department (INDEPENDENT_AMBULATORY_CARE_PROVIDER_SITE_OTHER)
Admission: EM | Admit: 2015-05-29 | Discharge: 2015-05-29 | Disposition: A | Discharge status: Home / Self Care | Payer: Self-pay | Source: Home / Self Care | Attending: Family Medicine | Admitting: Family Medicine

## 2015-05-29 DIAGNOSIS — B009 Herpesviral infection, unspecified: Secondary | ICD-10-CM

## 2015-05-29 MED ORDER — VALACYCLOVIR HCL 1 G PO TABS: 1000.0000 mg | ORAL_TABLET | Freq: Two times a day (BID) | ORAL | Status: DC

## 2015-05-29 MED ORDER — HYDROCODONE-ACETAMINOPHEN 5-325 MG PO TABS: 1.0000 | ORAL_TABLET | Freq: Four times a day (QID) | ORAL | Status: DC | PRN

## 2015-05-29 NOTE — ED Notes (Signed)
Patient has cough, cold, chest congestion, fever. Patient has particularly bad cold sore involving the majority of her lips

## 2015-05-29 NOTE — Discharge Instructions (Signed)
Cold Sore  A cold sore (fever blister) is a skin infection caused by the herpes simplex virus (HSV-1). HSV-1 is closely related to the virus that causes genital herpes (HSV-2), but they are not the same even though both viruses can cause oral and genital infections. Cold sores are small, fluid-filled sores inside of the mouth or on the lips, gums, nose, chin, cheeks, or fingers.   The herpes simplex virus can be easily passed (contagious) to other people through close personal contact, such as kissing or sharing personal items. The virus can also spread to other parts of the body, such as the eyes or genitals. Cold sores are contagious until the sores crust over completely. They often heal within 2 weeks.   Once a person is infected, the herpes simplex virus remains permanently in the body. Therefore, there is no cure for cold sores, and they often recur when a person is tired, stressed, sick, or gets too much sun. Additional factors that can cause a recurrence include hormone changes in menstruation or pregnancy, certain drugs, and cold weather.   CAUSES   Cold sores are caused by the herpes simplex virus. The virus is spread from person to person through close contact, such as through kissing, touching the affected area, or sharing personal items such as lip balm, razors, or eating utensils.   SYMPTOMS   The first infection may not cause symptoms. If symptoms develop, the symptoms often go through different stages. Here is how a cold sore develops:   · Tingling, itching, or burning is felt 1-2 days before the outbreak.    · Fluid-filled blisters appear on the lips, inside the mouth, nose, or on the cheeks.    · The blisters start to ooze clear fluid.    · The blisters dry up and a yellow crust appears in its place.    · The crust falls off.    Symptoms depend on whether it is the initial outbreak or a recurrence. Some other symptoms with the first outbreak may include:   · Fever.    · Sore throat.    · Headache.     · Muscle aches.    · Swollen neck glands.    DIAGNOSIS   A diagnosis is often made based on your symptoms and looking at the sores. Sometimes, a sore may be swabbed and then examined in the lab to make a final diagnosis. If the sores are not present, blood tests can find the herpes simplex virus.   TREATMENT   There is no cure for cold sores and no vaccine for the herpes simplex virus. Within 2 weeks, most cold sores go away on their own without treatment. Medicines cannot make the infection go away, but medicine can help relieve some of the pain associated with the sores, can work to stop the virus from multiplying, and can also shorten healing time. Medicine may be in the form of creams, gels, pills, or a shot.   HOME CARE INSTRUCTIONS   · Only take over-the-counter or prescription medicines for pain, discomfort, or fever as directed by your caregiver. Do not use aspirin.    · Use a cotton-tip swab to apply creams or gels to your sores.    · Do not touch the sores or pick the scabs. Wash your hands often. Do not touch your eyes without washing your hands first.    · Avoid kissing, oral sex, and sharing personal items until sores heal.    · Apply an ice pack on your sores for 10-15 minutes to ease any discomfort.    ·   Avoid hot, cold, or salty foods because they may hurt your mouth. Eat a soft, bland diet to avoid irritating the sores. Use a straw to drink if you have pain when drinking out of a glass.    · Keep sores clean and dry to prevent an infection of other tissues.    · Avoid the sun and limit stress if these things trigger outbreaks. If sun causes cold sores, apply sunscreen on the lips before being out in the sun.    SEEK MEDICAL CARE IF:   · You have a fever or persistent symptoms for more than 2-3 days.    · You have a fever and your symptoms suddenly get worse.    · You have pus, not clear fluid, coming from the sores.    · You have redness that is spreading.    · You have pain or irritation in your  eye.    · You get sores on your genitals.    · Your sores do not heal within 2 weeks.    · You have a weakened immune system.    · You have frequent recurrences of cold sores.    MAKE SURE YOU:   · Understand these instructions.  · Will watch your condition.  · Will get help right away if you are not doing well or get worse.  Document Released: 08/31/2000 Document Revised: 01/18/2014 Document Reviewed: 01/16/2012  [employer]® Patient Information ©2015 [employer]. This information is not intended to replace advice given to you by your health care provider. Make sure you discuss any questions you have with your health care provider.

## 2015-05-29 NOTE — ED Provider Notes (Signed)
CSN: [id number]     Arrival date & time 05/29/15  1653 History   First MD Initiated Contact with Patient 05/29/15 1726     No chief complaint on file.  (Consider location/radiation/quality/duration/timing/severity/associated sxs/prior Treatment) The history is provided by the patient.   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 She's from [place] originally.  Beginning on Friday patient developed sores around her mouth and cough and ear pain on the left. She's never had these mouth sores. They Did become quite tender to touch.  Past Medical History  Diagnosis Date  . Abnormal Pap smear     at HD prior to becoming pregnant, unsure of what was abnormal  . History of breast surgery     removed a lump   Past Surgical History  Procedure Laterality Date  . Dilation and curettage of uterus      miscarriage  . Breast surgery      had lump removed   Family History  Problem Relation Age of Onset  . Hypertension Father   . Hypertension Sister   . Hypertension Sister   . Hypertension Sister    Social History  Substance Use Topics  . Smoking status: Never Smoker   . Smokeless tobacco: Never Used  . Alcohol Use: No   OB History    Gravida Para Term Preterm AB TAB SAB Ectopic Multiple Living   7 4 4  3  3   4      Review of Systems  Constitutional: Positive for fever and fatigue.  HENT: Positive for ear pain, facial swelling and mouth sores.   Eyes: Negative.   Respiratory: Positive for cough.   Cardiovascular: Negative.     Allergies  Review of patient's allergies indicates no known allergies.  Home Medications   Prior to Admission medications   Medication Sig Start Date End Date Taking? Authorizing Provider  cetirizine (ZYRTEC) 10 MG tablet Take 1 tablet (10 mg total) by mouth daily. 12/20/14   Josalyn Funches, MD  ibuprofen (ADVIL,MOTRIN) 200 MG tablet Take 200 mg by mouth every 6 (six) hours as needed. For pain     Historical Provider, MD  ranitidine  (ZANTAC) 150 MG tablet Take 1 tablet (150 mg total) by mouth at bedtime. 12/20/14   Boykin Nearing, MD   Meds Ordered and Administered this Visit  Medications - No data to display  BP 156/85 mmHg  Pulse 76  Temp(Src) 100.4 F (38 C) (Oral)  Resp 16  SpO2 97% No data found.   Physical Exam  Constitutional: She is oriented to person, place, and time. She appears well-developed and well-nourished.  HENT:  Head: Normocephalic and atraumatic.  Right Ear: External ear normal.  Left Ear: External ear normal.  Patient has marked vesicular eruption on the per million border of her upper lip, and several vesicles on the lower lip in the midline. Pharynx is normal. TMs are normal.  Eyes: Conjunctivae and EOM are normal. Pupils are equal, round, and reactive to light.  Neck: Normal range of motion. Neck supple.  Cardiovascular: Normal rate and normal heart sounds.   Pulmonary/Chest: Effort normal and breath sounds normal.  Musculoskeletal: Normal range of motion.  Neurological: She is alert and oriented to person, place, and time. No cranial nerve deficit.  Skin:  Mark vesicular eruption on the lips with underlying swelling  Nursing note and vitals reviewed.   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 She's going to need some a medicine as well as Valtrex.  Valtrex 1000 bid x 10 days and norco prescribed  Robyn Haber, MD   Robyn Haber, MD 05/29/15 [phone]

## 2015-06-06 ENCOUNTER — Ambulatory Visit: Payer: Self-pay

## 2015-06-07 ENCOUNTER — Emergency Department (INDEPENDENT_AMBULATORY_CARE_PROVIDER_SITE_OTHER)
Admission: EM | Admit: 2015-06-07 | Discharge: 2015-06-07 | Disposition: A | Discharge status: Home / Self Care | Payer: Self-pay | Source: Home / Self Care | Attending: Family Medicine | Admitting: Family Medicine

## 2015-06-07 ENCOUNTER — Ambulatory Visit: Payer: Self-pay | Attending: Family Medicine

## 2015-06-07 ENCOUNTER — Encounter (HOSPITAL_COMMUNITY): Payer: Self-pay

## 2015-06-07 DIAGNOSIS — B009 Herpesviral infection, unspecified: Secondary | ICD-10-CM

## 2015-06-07 DIAGNOSIS — J40 Bronchitis, not specified as acute or chronic: Secondary | ICD-10-CM

## 2015-06-07 DIAGNOSIS — H109 Unspecified conjunctivitis: Secondary | ICD-10-CM

## 2015-06-07 MED ORDER — AZITHROMYCIN 250 MG PO TABS: ORAL_TABLET | ORAL | Status: DC

## 2015-06-07 MED ORDER — TETRACAINE HCL 0.5 % OP SOLN
1.0000 [drp] | Freq: Once | OPHTHALMIC | Status: AC
  Administered 2015-06-07: 1 [drp] via OPHTHALMIC

## 2015-06-07 MED ORDER — TOBRAMYCIN-DEXAMETHASONE 0.3-0.1 % OP SUSP: 1.0000 [drp] | Freq: Three times a day (TID) | OPHTHALMIC | Status: DC

## 2015-06-07 MED ORDER — TETRACAINE HCL 0.5 % OP SOLN
OPHTHALMIC | Status: AC
  Filled 2015-06-07: qty 2

## 2015-06-07 NOTE — ED Notes (Signed)
Patient complains of both eyes red and burning that  Started yesterday Patient denies any discharge Also complains of having a cough and some congestion

## 2015-06-07 NOTE — Discharge Instructions (Signed)
Finish the Valtrex to prevent recurrence of the lip sores.   Bacterial Conjunctivitis Bacterial conjunctivitis (commonly called pink eye) is redness, soreness, or puffiness (inflammation) of the white part of your eye. It is caused by a germ called bacteria. These germs can easily spread from person to person (contagious). Your eye often will become red or pink. Your eye may also become irritated, watery, or have a thick discharge.  HOME CARE   Apply a cool, clean washcloth over closed eyelids. Do this for 10-20 minutes, 3-4 times a day while you have pain.  Gently wipe away any fluid coming from the eye with a warm, wet washcloth or cotton ball.  Wash your hands often with soap and water. Use paper towels to dry your hands.  Do not share towels or washcloths.  Change or wash your pillowcase every day.  Do not use eye makeup until the infection is gone.  Do not use machines or drive if your vision is blurry.  Stop using contact lenses. Do not use them again until your doctor says it is okay.  Do not touch the tip of the eye drop bottle or medicine tube with your fingers when you put medicine on the eye. GET HELP RIGHT AWAY IF:   Your eye is not better after 3 days of starting your medicine.  You have a yellowish fluid coming out of the eye.  You have more pain in the eye.  Your eye redness is spreading.  Your vision becomes blurry.  You have a fever or lasting symptoms for more than 2-3 days.  You have a fever and your symptoms suddenly get worse.  You have pain in the face.  Your face gets red or puffy (swollen). MAKE SURE YOU:   Understand these instructions.  Will watch this condition.  Will get help right away if you are not doing well or get worse. Document Released: 06/12/2008 Document Revised: 08/20/2012 Document Reviewed: 05/09/2012 [facility] Patient Information 2015 [place], [place]. This information is not intended to replace advice given to you by your  health care provider. Make sure you discuss any questions you have with your health care provider.

## 2015-06-07 NOTE — ED Provider Notes (Signed)
CSN: [id number]     Arrival date & time 06/07/15  1940 History   First MD Initiated Contact with Patient 06/07/15 2036     Chief Complaint  Patient presents with  . Conjunctivitis   (Consider location/radiation/quality/duration/timing/severity/associated sxs/prior Treatment) Patient is a 39 y.o. female presenting with conjunctivitis. The history is provided by the patient and the spouse.  Conjunctivitis This is a new problem. The current episode started 2 days ago. The problem occurs constantly. The problem has been gradually worsening.   this 39 year old married woman is from [place]. She presents with 2 days of progressive eye pain after getting toothpaste in both eyes when brushing her daughter's teeth. She was seen here last week for HSV-1 on her lips. At that time she had a cough and the cough is persisted. She stopped taking her Valtrex today area  Past Medical History  Diagnosis Date  . Abnormal Pap smear     at HD prior to becoming pregnant, unsure of what was abnormal  . History of breast surgery     removed a lump   Past Surgical History  Procedure Laterality Date  . Dilation and curettage of uterus      miscarriage  . Breast surgery      had lump removed   Family History  Problem Relation Age of Onset  . Hypertension Father   . Hypertension Sister   . Hypertension Sister   . Hypertension Sister    Social History  Substance Use Topics  . Smoking status: Never Smoker   . Smokeless tobacco: Never Used  . Alcohol Use: No   OB History    Gravida Para Term Preterm AB TAB SAB Ectopic Multiple Living   7 4 4  3  3   4      Review of Systems  Constitutional: Negative.   HENT: Positive for congestion.   Eyes: Positive for photophobia, pain, discharge, redness and itching.  Respiratory: Positive for cough.   Cardiovascular: Negative.   Gastrointestinal: Negative.   Genitourinary: Negative.   Neurological: Negative.     Allergies  Review of patient's  allergies indicates no known allergies.  Home Medications   Prior to Admission medications   Medication Sig Start Date End Date Taking? Authorizing Provider  cetirizine (ZYRTEC) 10 MG tablet Take 1 tablet (10 mg total) by mouth daily. 12/20/14   Boykin Nearing, MD  HYDROcodone-acetaminophen (NORCO) 5-325 MG per tablet Take 1 tablet by mouth every 6 (six) hours as needed for moderate pain. 05/29/15   Robyn Haber, MD  ibuprofen (ADVIL,MOTRIN) 200 MG tablet Take 200 mg by mouth every 6 (six) hours as needed. For pain     Historical Provider, MD  ranitidine (ZANTAC) 150 MG tablet Take 1 tablet (150 mg total) by mouth at bedtime. 12/20/14   Josalyn Funches, MD  valACYclovir (VALTREX) 1000 MG tablet Take 1 tablet (1,000 mg total) by mouth 2 (two) times daily. 05/29/15   Robyn Haber, MD   Meds Ordered and Administered this Visit  Medications - No data to display  BP 145/92 mmHg  Pulse 81  Temp(Src) 99.1 F (37.3 C) (Oral)  Resp 16  SpO2 100% No data found.   Physical Exam  Constitutional: She is oriented to person, place, and time. She appears well-developed and well-nourished.  HENT:  Head: Normocephalic and atraumatic.  Right Ear: External ear normal.  Left Ear: External ear normal.  Mouth/Throat: Oropharynx is clear and moist.  Eyes: EOM are normal. Pupils are equal, round,  and reactive to light. Right eye exhibits discharge. Left eye exhibits discharge. No scleral icterus.  Bilateral injection of the eyes.  Neck: Normal range of motion. Neck supple. No thyromegaly present.  Cardiovascular: Normal rate, regular rhythm and normal heart sounds.   Pulmonary/Chest: Effort normal. No respiratory distress.  Bilateral rhonchi  Musculoskeletal: Normal range of motion.  Lymphadenopathy:    She has no cervical adenopathy.  Neurological: She is alert and oriented to person, place, and time.  Skin: Skin is warm and dry.  Patient has healing lips from the HSV-1 infection she is indoors  over the last week.  Nursing note and vitals reviewed.   ED Course  Procedures (including critical care time)     MDM   Patient has conjunctivitis, bronchitis, and resolving HSV-1 labialis.  Plan: TobraDex 3 times a day 3 days, Z-Pak, finish Valtrex.  Signed, Robyn Haber M.D.   Robyn Haber, MD 06/07/15 2049

## 2015-06-08 ENCOUNTER — Telehealth: Payer: Self-pay

## 2015-06-08 NOTE — Telephone Encounter (Signed)
Pharm called and reported they don't carry the Tobradex eye drops. While I was holding for pharmacist to suggest another, they came back on the line and reported they can order it, so no need to change.

## 2015-09-05 ENCOUNTER — Ambulatory Visit: Payer: Self-pay | Attending: Family Medicine

## 2018-07-14 ENCOUNTER — Ambulatory Visit: Payer: Self-pay | Attending: Family Medicine | Admitting: Family Medicine

## 2018-07-14 ENCOUNTER — Other Ambulatory Visit: Payer: Self-pay

## 2018-07-14 ENCOUNTER — Encounter: Payer: Self-pay | Admitting: Family Medicine

## 2018-07-14 VITALS — BP 148/94 | HR 78 | Temp 98.4°F | Resp 18 | Ht 64.0 in | Wt 152.8 lb

## 2018-07-14 DIAGNOSIS — Z2821 Immunization not carried out because of patient refusal: Secondary | ICD-10-CM | POA: Insufficient documentation

## 2018-07-14 DIAGNOSIS — Z8249 Family history of ischemic heart disease and other diseases of the circulatory system: Secondary | ICD-10-CM | POA: Insufficient documentation

## 2018-07-14 DIAGNOSIS — N6320 Unspecified lump in the left breast, unspecified quadrant: Secondary | ICD-10-CM

## 2018-07-14 DIAGNOSIS — J40 Bronchitis, not specified as acute or chronic: Secondary | ICD-10-CM

## 2018-07-14 DIAGNOSIS — R51 Headache: Secondary | ICD-10-CM | POA: Insufficient documentation

## 2018-07-14 DIAGNOSIS — Z7689 Persons encountering health services in other specified circumstances: Secondary | ICD-10-CM | POA: Insufficient documentation

## 2018-07-14 MED ORDER — AZITHROMYCIN 250 MG PO TABS: ORAL_TABLET | ORAL | 0 refills | Status: DC

## 2018-07-14 MED FILL — AZITHROMYCIN 250 MG TABLET: 250 | 5 days supply | Qty: 6 | Fill #0

## 2018-07-14 NOTE — Progress Notes (Signed)
Subjective:    Patient ID: Joyce Garcia, female    DOB: 07-Jan-1976, 42 y.o.   MRN: [id number]  HPI 42 year old female who presents to reestablish care.  Patient was last seen in the office on December 20, 2014.  Patient with complaint of 2 weeks of a productive cough.  Patient states that her sputum is beige in color.  Patient states when she is lying down she has an abnormal sound in her chest.  Patient has had some headaches which occur with coughing.  Patient denies any fever or chills.  No dizziness.  No ear pain, no nasal congestion.  Patient did have a sore throat 2 weeks ago with initial onset of symptoms.  Patient also with complaint of feeling a nontender lump in her left breast for the past 4 to 5 months.  Patient states that the area is not tender but she occasionally has a pressure sensation in this area.  Patient reports that in 2015 or 2016 she had a lump removed from the left breast but this was benign.        Patient reports no significant past medical history and states that she is not on any current long-term medications.  Patient denies any drug or other allergies.  Patient states her family history is significant for father having hypertension as well as 2 of her sisters and one brother with hypertension.  Patient has also had a sister with uterine cancer.  Patient is married and does not drink or smoke.  Patient's past surgical history is significant for prior removal of benign left breast mass. Past Medical History:  Diagnosis Date  . Abnormal Pap smear    at HD prior to becoming pregnant, unsure of what was abnormal  . History of breast surgery    removed a lump   Past Surgical History:  Procedure Laterality Date  . BREAST SURGERY     had lump removed  . DILATION AND CURETTAGE OF UTERUS     miscarriage   Family History  Problem Relation Age of Onset  . Hypertension Father   . Hypertension Sister   . Hypertension Sister   . Hypertension Sister    Social  History   Tobacco Use  . Smoking status: Never Smoker  . Smokeless tobacco: Never Used  Substance Use Topics  . Alcohol use: No  . Drug use: No  No Known Allergies   Review of Systems  Constitutional: Positive for fatigue. Negative for chills and fever.  HENT: Negative for congestion, ear pain, sinus pressure, sinus pain, sore throat and trouble swallowing.   Respiratory: Positive for cough and shortness of breath.   Cardiovascular: Negative for chest pain and leg swelling.  Gastrointestinal: Negative for abdominal pain and nausea.  Endocrine: Negative for polydipsia, polyphagia and polyuria.  Genitourinary: Negative for dysuria and frequency.  Musculoskeletal: Negative for arthralgias, back pain, gait problem and joint swelling.  Neurological: Negative for dizziness and headaches.       Objective:   Physical Exam BP (!) 148/94   Pulse 78   Temp 98.4 F (36.9 C) (Oral)   Resp 18   Ht 5\' 4"  (1.626 m)   Wt 152 lb 12.8 oz (69.3 kg)   LMP 07/06/2018   SpO2 98%   Breastfeeding? No   BMI 26.23 kg/m Vital signs and nurse's notes reviewed General-well-nourished, well-developed female in no acute distress.  Patient with infrequent, nonproductive cough while in the exam room ENT- TMs dull, patient with moderate edema  of the nasal turbinates, mild posterior pharynx erythema Neck-supple, no lymphadenopathy, no thyromegaly Lungs-clear to auscultation bilaterally, mild decrease in breath sounds at the lung bases, no wheezing and no accessory muscle use Cardiovascular-regular rate and rhythm Breast exam- patient does not have any right-sided axillary adenopathy, no right-sided breast tenderness, no palpable masses and no skin changes.  On the left, patient does not have any palpable axillary adenopathy, patient does have increased area of tissue density in the left upper medial breast approximately 10 to 11 o'clock position and this area is slightly tender.  Patient also has a palpable,  round mass in the left breast beneath the left medial areaola at approximately 3:00 Back-no CVA tenderness. Extremities-no edema Psych- normal mood and judgment, patient expresses some anxiety regarding breast mass      Assessment & Plan:  1. Bronchitis Patient with symptoms suggestive of bronchitis.  Patient is encouraged to get an over-the-counter medication such as Robitussin-DM to help with cough and chest congestion.  Prescription provided for azithromycin Z-Pak which patient has taken in the past without difficulty - azithromycin (ZITHROMAX) 250 MG tablet; Take 2 pills on the first day then one pill daily for the next 4 days  Dispense: 6 tablet; Refill: 0  2. Left breast mass Patient with left breast mass on exam and patient will be referred to have ultrasound of the left breast and diagnostic mammogram. - MM Digital Diagnostic Bilat; Future - US BREAST LTD UNI LEFT INC AXILLA; Future  *Influenza immunization offered at today's visit but declined  An After Visit Summary was printed and given to the patient.  .Return in about 2 weeks (around 07/28/2018) for pap needed.

## 2018-07-14 NOTE — Progress Notes (Signed)
Flu shot: no Pain: 0, coughing and pressure in head when coughing, mostly at night, more than 2 weeks   Felt lump in left breast, about 4/5 months ago.

## 2018-07-28 ENCOUNTER — Ambulatory Visit: Payer: Self-pay | Attending: Family Medicine | Admitting: Family Medicine

## 2018-07-28 ENCOUNTER — Encounter: Payer: Self-pay | Admitting: Family Medicine

## 2018-07-28 VITALS — BP 137/79 | HR 70 | Resp 16 | Ht 65.5 in | Wt 153.0 lb

## 2018-07-28 DIAGNOSIS — Z2821 Immunization not carried out because of patient refusal: Secondary | ICD-10-CM | POA: Insufficient documentation

## 2018-07-28 DIAGNOSIS — N644 Mastodynia: Secondary | ICD-10-CM

## 2018-07-28 DIAGNOSIS — Z124 Encounter for screening for malignant neoplasm of cervix: Secondary | ICD-10-CM

## 2018-07-28 DIAGNOSIS — Z791 Long term (current) use of non-steroidal anti-inflammatories (NSAID): Secondary | ICD-10-CM | POA: Insufficient documentation

## 2018-07-28 NOTE — Progress Notes (Signed)
Subjective:    Patient ID: Joyce Garcia, female    DOB: [birth year], 42 y.o.   MRN: [id number]  HPI 42 yo female here for pap smear as a screening for cervical cancer. Patient reports LMP of Jul 06, 2018. Patient reports that she never received a call regarding mammogram and US of the breast ordered after her recent visit for left breast mass and tenderness. Patient states that the nodule has resolved and she has had pain x 2 that has now resolved as well but she would still like the diagnostic mammogram and [place] ordered.  Patient denies any pelvic pain and no vaginal discharge.  Patient denies any possibility of pregnancy.  Patient states that overall she feels well.  Patient is anxious about the mammogram however.  Patient reports no family history of breast cancer. Past Medical History:  Diagnosis Date  . Abnormal Pap smear    at [facility] prior to becoming pregnant, unsure of what was abnormal  . History of breast surgery    removed a lump   Past Surgical History:  Procedure Laterality Date  . BREAST SURGERY     had lump removed  . DILATION AND CURETTAGE OF UTERUS     miscarriage   Family History  Problem Relation Age of Onset  . Hypertension Father   . Hypertension Sister   . Hypertension Sister   . Hypertension Sister    Social History   Tobacco Use  . Smoking status: Never Smoker  . Smokeless tobacco: Never Used  Substance Use Topics  . Alcohol use: No  . Drug use: No  No Known Allergies   Review of Systems  Constitutional: Negative for chills, fatigue and fever.  HENT: Negative for congestion, sore throat and trouble swallowing.   Respiratory: Negative for cough and shortness of breath.   Cardiovascular: Negative for chest pain, palpitations and leg swelling.  Gastrointestinal: Negative for abdominal pain, blood in stool, constipation, diarrhea and nausea.  Endocrine: Negative for polydipsia, polyphagia and polyuria.  Genitourinary: Negative for dysuria, flank  pain, frequency, menstrual problem, vaginal discharge and vaginal pain.  Musculoskeletal: Negative for arthralgias, back pain, gait problem, joint swelling and myalgias.  Neurological: Negative for dizziness and headaches.       Objective:   Physical Exam BP 137/79 (BP Location: Right Arm, Patient Position: Sitting, Cuff Size: Normal)   Pulse 70   Resp 16   Ht 5' 5.5" (1.664 m)   Wt 153 lb (69.4 kg)   LMP 07/06/2018   BMI 25.07 kg/m   Nurse's notes and vital signs reviewed General-well-nourished, well-developed female in no acute distress Lungs-clear to auscultation bilaterally Cardiovascular-regular rate and rhythm Abdomen-soft, nontender Back-no CVA tenderness GU-normal external genitalia, normal appearance to the vaginal canal, no vaginal discharge noted, normal appearance to the cervix though patient did have some mild bleeding with collection of endocervical cells and possible presence of small cervical polyp.  No adnexal tenderness and no cervical motion tenderness on bimanual exam.  Patient did exhibit a lot of anxiety with collection of Pap as seem to anticipate discomfort with sample collection for Pap  (but she did not have discomfort.)      Assessment & Plan:  1. Screening for cervical cancer Patient had Pap smear done at today's visit.  Patient reports last menstrual period of July 06, 2018 and patient did not have any presence of vaginal discharge, no CMT and no adnexal tenderness.  Patient's last Pap was in 2014 and was within normal.  Patient will be notified of the results and if any further evaluation is needed based on these results. - Cytology - PAP([facility])  2. Breast tenderness in female I have patient's most recent visit, patient had complaint of some left-sided breast tenderness and on exam, patient did have palpable breast nodule which patient reports has now resolved.  Patient mentioned at the end of her visit that she did not ever receive a call  regarding scheduling for her diagnostic mammogram and ultrasound.  New order placed and written order was also given to [place] working with me today who was different from [place] at patient's last visit.  Patient is to call next week if she has not heard from the breast Center regarding scheduling.  Patient technically reports resolution of both discomfort as well as previous nodule that she felt below the left nipple but patient request to still have the diagnostic mammogram and ultrasound versus screening mammogram. - MM Digital Diagnostic Bilat; Future - US BREAST LTD UNI LEFT INC AXILLA; Future  Allergies as of 07/28/2018   No Known Allergies     Medication List        Accurate as of 07/28/18 12:47 PM. Always use your most recent med list.          ibuprofen 200 MG tablet Commonly known as:  ADVIL,MOTRIN Take 200 mg by mouth every 6 (six) hours as needed. For pain     An After Visit Summary was printed and given to the patient.  *Influenza immunization was offered but declined by the patient  Return if symptoms worsen or fail to improve, for yearly and as needed.

## 2018-07-30 LAB — CYTOLOGY - PAP
Diagnosis: NEGATIVE
HPV: NOT DETECTED

## 2018-08-01 ENCOUNTER — Telehealth (INDEPENDENT_AMBULATORY_CARE_PROVIDER_SITE_OTHER): Payer: Self-pay

## 2018-08-01 NOTE — Telephone Encounter (Signed)
Patient verified DOB. Patient is aware that pap was normal. Nat Christen, CMA

## 2018-08-01 NOTE — Telephone Encounter (Signed)
-----   Message from Antony Blackbird, MD sent at 07/31/2018  3:24 PM EST ----- Notify patient of normal pap smear

## 2018-08-05 ENCOUNTER — Ambulatory Visit: Payer: Self-pay | Attending: Family Medicine

## 2018-08-06 ENCOUNTER — Ambulatory Visit: Payer: Self-pay

## 2018-08-18 ENCOUNTER — Telehealth: Payer: Self-pay | Admitting: Family Medicine

## 2018-08-18 NOTE — Telephone Encounter (Signed)
I called Pt LVM to informed her that the document she brought to the office was uncompleted, that she has until 08/20/18 to bring the rest of the document or need to schedule a new appt and need to bring all new document with a new application

## 2019-09-03 ENCOUNTER — Inpatient Hospital Stay (HOSPITAL_COMMUNITY): Payer: Self-pay

## 2019-09-03 ENCOUNTER — Inpatient Hospital Stay (HOSPITAL_COMMUNITY)
Admission: EM | Admit: 2019-09-03 | Discharge: 2019-09-03 | Disposition: A | Discharge status: Home / Self Care | Payer: Self-pay | Attending: Family Medicine | Admitting: Family Medicine

## 2019-09-03 ENCOUNTER — Encounter (HOSPITAL_COMMUNITY): Payer: Self-pay

## 2019-09-03 ENCOUNTER — Other Ambulatory Visit: Payer: Self-pay

## 2019-09-03 DIAGNOSIS — O039 Complete or unspecified spontaneous abortion without complication: Secondary | ICD-10-CM

## 2019-09-03 DIAGNOSIS — O3411 Maternal care for benign tumor of corpus uteri, first trimester: Secondary | ICD-10-CM | POA: Insufficient documentation

## 2019-09-03 DIAGNOSIS — D259 Leiomyoma of uterus, unspecified: Secondary | ICD-10-CM | POA: Insufficient documentation

## 2019-09-03 DIAGNOSIS — O09521 Supervision of elderly multigravida, first trimester: Secondary | ICD-10-CM | POA: Insufficient documentation

## 2019-09-03 DIAGNOSIS — Z3A12 12 weeks gestation of pregnancy: Secondary | ICD-10-CM | POA: Insufficient documentation

## 2019-09-03 LAB — CBC
HCT: 38.8 % (ref 36.0–46.0)
Hemoglobin: 12.6 g/dL (ref 12.0–15.0)
MCH: 32.1 pg (ref 26.0–34.0)
MCHC: 32.5 g/dL (ref 30.0–36.0)
MCV: 99 fL (ref 80.0–100.0)
Platelets: 332 10*3/uL (ref 150–400)
RBC: 3.92 MIL/uL (ref 3.87–5.11)
RDW: 13.1 % (ref 11.5–15.5)
WBC: 9.1 10*3/uL (ref 4.0–10.5)
nRBC: 0 % (ref 0.0–0.2)

## 2019-09-03 LAB — TYPE AND SCREEN
ABO/RH(D): O POS
Antibody Screen: NEGATIVE

## 2019-09-03 LAB — COMPREHENSIVE METABOLIC PANEL
ALT: 18 U/L (ref 0–44)
AST: 19 U/L (ref 15–41)
Albumin: 3.5 g/dL (ref 3.5–5.0)
Alkaline Phosphatase: 48 U/L (ref 38–126)
Anion gap: 9 (ref 5–15)
BUN: 15 mg/dL (ref 6–20)
CO2: 25 mmol/L (ref 22–32)
Calcium: 9.3 mg/dL (ref 8.9–10.3)
Chloride: 102 mmol/L (ref 98–111)
Creatinine, Ser: 0.74 mg/dL (ref 0.44–1.00)
GFR calc Af Amer: >60 mL/min (ref >60)
GFR calc non Af Amer: >60 mL/min (ref >60)
Glucose, Bld: 103 mg/dL — ABNORMAL HIGH (ref 70–99)
Potassium: 3.9 mmol/L (ref 3.5–5.1)
Sodium: 136 mmol/L (ref 135–145)
Total Bilirubin: 0.3 mg/dL (ref 0.3–1.2)
Total Protein: 7.6 g/dL (ref 6.5–8.1)

## 2019-09-03 LAB — I-STAT BETA HCG BLOOD, ED (MC, WL, AP ONLY): I-stat hCG, quantitative: 629.1 m[IU]/mL — ABNORMAL HIGH (ref <5)

## 2019-09-03 LAB — ABO/RH: ABO/RH(D): O POS

## 2019-09-03 MED ORDER — IBUPROFEN 600 MG PO TABS: 600.0000 mg | ORAL_TABLET | Freq: Four times a day (QID) | ORAL | 1 refills | Status: DC | PRN

## 2019-09-03 MED ORDER — IBUPROFEN 600 MG PO TABS: 600.0000 mg | ORAL_TABLET | Freq: Four times a day (QID) | ORAL | 1 refills | Status: AC | PRN

## 2019-09-03 MED ORDER — IBUPROFEN 600 MG PO TABS
600.0000 mg | ORAL_TABLET | Freq: Once | ORAL | Status: AC
  Administered 2019-09-03: 07:00:00 600 mg via ORAL
  Filled 2019-09-03: qty 1

## 2019-09-03 MED FILL — ?IBUPROFEN 600 MG TABLETS: 600 | 7 days supply | Qty: 30 | Fill #0

## 2019-09-03 NOTE — Discharge Instructions (Signed)
Miscarriage °A miscarriage is the loss of an unborn baby (fetus) before the 20th week of pregnancy. Most miscarriages happen during the first 3 months of pregnancy. Sometimes, a miscarriage can happen before a woman knows that she is pregnant. °Having a miscarriage can be an emotional experience. If you have had a miscarriage, talk with your health care provider about any questions you may have about miscarrying, the grieving process, and your plans for future pregnancy. °What are the causes? °A miscarriage may be caused by: °· Problems with the genes or chromosomes of the fetus. These problems make it impossible for the baby to develop normally. They are often the result of random errors that occur early in the development of the baby, and are not passed from parent to child (not inherited). °· Infection of the cervix or uterus. °· Conditions that affect hormone balance in the body. °· Problems with the cervix, such as the cervix opening and thinning before pregnancy is at term (cervical insufficiency). °· Problems with the uterus. These may include: °? A uterus with an abnormal shape. °? Fibroids in the uterus. °? Congenital abnormalities. These are problems that were present at birth. °· Certain medical conditions. °· Smoking, drinking alcohol, or using drugs. °· Injury (trauma). °In many cases, the cause of a miscarriage is not known. °What are the signs or symptoms? °Symptoms of this condition include: °· Vaginal bleeding or spotting, with or without cramps or pain. °· Pain or cramping in the abdomen or lower back. °· Passing fluid, tissue, or blood clots from the vagina. °How is this diagnosed? °This condition may be diagnosed based on: °· A physical exam. °· Ultrasound. °· Blood tests. °· Urine tests. °How is this treated? °Treatment for a miscarriage is sometimes not necessary if you naturally pass all the tissue that was in your uterus. If necessary, this condition may be treated with: °· Dilation and  curettage (D&C). This is a procedure in which the cervix is stretched open and the lining of the uterus (endometrium) is scraped. This is done only if tissue from the fetus or placenta remains in the body (incomplete miscarriage). °· Medicines, such as: °? Antibiotic medicine, to treat infection. °? Medicine to help the body pass any remaining tissue. °? Medicine to reduce (contract) the size of the uterus. These medicines may be given if you have a lot of bleeding. °If you have Rh negative blood and your baby was Rh positive, you will need a shot of a medicine called Rh immunoglobulinto protect your future babies from Rh blood problems. "Rh-negative" and "Rh-positive" refer to whether or not the blood has a specific protein found on the surface of red blood cells (Rh factor). °Follow these instructions at home: °Medicines ° °· Take over-the-counter and prescription medicines only as told by your health care provider. °· If you were prescribed antibiotic medicine, take it as told by your health care provider. Do not stop taking the antibiotic even if you start to feel better. °· Do not take NSAIDs, such as aspirin and ibuprofen, unless they are approved by your health care provider. These medicines can cause bleeding. °Activity °· Rest as directed. Ask your health care provider what activities are safe for you. °· Have someone help with home and family responsibilities during this time. °General instructions °· Keep track of the number of sanitary pads you use each day and how soaked (saturated) they are. Write down this information. °· Monitor the amount of tissue or blood clots that   you pass from your vagina. Save any large amounts of tissue for your health care provider to examine.  Do not use tampons, douche, or have sex until your health care provider approves.  To help you and your partner with the process of grieving, talk with your health care provider or seek counseling.  When you are ready, meet with  your health care provider to discuss any important steps you should take for your health. Also, discuss steps you should take to have a healthy pregnancy in the future.  Keep all follow-up visits as told by your health care provider. This is important. Where to find more information  The American Congress of Obstetricians and Gynecologists: www.acog.org  U.S. Department of Health and Programmer, systems of Womens Health: [place] Contact a health care provider if:  You have a fever or chills.  You have a foul smelling vaginal discharge.  You have more bleeding instead of less. Get help right away if:  You have severe cramps or pain in your back or abdomen.  You pass blood clots or tissue from your vagina that is walnut-sized or larger.  You soak more than 1 regular sanitary pad in an hour.  You become light-headed or weak.  You pass out.  You have feelings of sadness that take over your thoughts, or you have thoughts of hurting yourself. Summary  Most miscarriages happen in the first 3 months of pregnancy. Sometimes miscarriage happens before a woman even knows that she is pregnant.  Follow your health care provider's instruction for home care. Keep all follow-up appointments.  To help you and your partner with the process of grieving, talk with your health care provider or seek counseling. This information is not intended to replace advice given to you by your health care provider. Make sure you discuss any questions you have with your health care provider. Document Released: 02/27/2001 Document Revised: 12/26/2018 Document Reviewed: 10/09/2016 Elsevier Patient Education  2020 [employer].

## 2019-09-03 NOTE — ED Notes (Signed)
Spoke with Vicente Males at [facility] will see in MAU

## 2019-09-03 NOTE — MAU Note (Signed)
Was told at [facility] had miscarriage. Has had cramping and heavy vag bleeding since yesterday am. Came to MAU from [facility] ED. States having cramping every 19mins or so since last night

## 2019-09-03 NOTE — MAU Provider Note (Signed)
Chief Complaint: Abdominal Cramping, Vaginal Bleeding, and Abdominal Pain   First Provider Initiated Contact with Patient 09/03/19 0600      SUBJECTIVE HPI: Joyce Garcia is a 43 y.o. [id number] at [redacted]w[redacted]d by LMP who presents to maternity admissions reporting bleeding and cramping.  Passed two clots.   Was seen in [facility] ED on 12/10-11.  Was told she had declining HCG levels and nothing seen in uterus or adnexae.  Was thought to have had a miscarriage.  Plan was to repeat HCG in 2 weeks.  No suspicion for ectopic according to their notes. . She denies vaginal itching/burning, urinary symptoms, h/a, dizziness, n/v, or fever/chills.    HCG levels:  08/27/19:  [id number]                       08/28/19:  [id number]                        09/03/19:      629 (here in [place] tonight)  Abdominal Cramping This is a recurrent problem. The current episode started in the past 7 days. The onset quality is gradual. The problem occurs intermittently. The problem has been unchanged. The quality of the pain is cramping. The abdominal pain does not radiate. Pertinent negatives include no constipation, diarrhea, fever or nausea. Nothing aggravates the pain. The pain is relieved by nothing. She has tried nothing for the symptoms.  Vaginal Bleeding The patient's primary symptoms include pelvic pain and vaginal bleeding. The patient's pertinent negatives include no genital itching, genital lesions or genital odor. This is a recurrent problem. The current episode started in the past 7 days. The problem occurs intermittently. The problem has been gradually improving. Associated symptoms include abdominal pain. Pertinent negatives include no back pain, chills, constipation, diarrhea, fever or nausea. The vaginal discharge was bloody. The vaginal bleeding is lighter than menses. She has been passing clots. She has not been passing tissue. Nothing aggravates the  symptoms. She has tried nothing for the symptoms.   RN Note: Was told at [facility] had miscarriage. Has had cramping and heavy vag bleeding since yesterday am. Came to MAU from [facility] ED. States having cramping every 43mins or so since last night  Past Medical History:  Diagnosis Date  . Abnormal Pap smear    at HD prior to becoming pregnant, unsure of what was abnormal  . History of breast surgery    removed a lump   Past Surgical History:  Procedure Laterality Date  . BREAST SURGERY     had lump removed  . DILATION AND CURETTAGE OF UTERUS     miscarriage   Social History   Socioeconomic History  . Marital status: Single    Spouse name: Not on file  . Number of children: Not on file  . Years of education: Not on file  . Highest education level: Not on file  Occupational History  . Not on file  Tobacco Use  . Smoking status: Never Smoker  . Smokeless tobacco: Never Used  Substance and Sexual Activity  . Alcohol use: No  . Drug use: No  . Sexual activity: Yes    Birth control/protection: None  Other Topics Concern  . Not on file  Social History Narrative   With boyfriend x 10 years.  Has 4 children.  2 from this marriage and 2 from previous marriage. [place] came to [place].    Social Determinants of Health   Financial Resource Strain:   . Difficulty of Paying Living Expenses: Not on file  Food Insecurity:   . Worried About Charity fundraiser in the Last Year: Not on file  . Ran Out of Food in the Last Year: Not on file  Transportation Needs:   . Lack of Transportation (Medical): Not on file  . Lack of Transportation (Non-Medical): Not on file  Physical Activity:   . Days of Exercise per Week: Not on file  . Minutes of Exercise per Session: Not on file  Stress:   . Feeling of Stress : Not on file  Social Connections:   . Frequency of Communication with Friends and Family: Not on file  . Frequency of Social Gatherings with Friends and Family:  Not on file  . Attends Religious Services: Not on file  . Active Member of Clubs or Organizations: Not on file  . Attends Archivist Meetings: Not on file  . Marital Status: Not on file  Intimate Partner Violence:   . Fear of Current or Ex-Partner: Not on file  . Emotionally Abused: Not on file  . Physically Abused: Not on file  . Sexually Abused: Not on file   No current facility-administered medications on file prior to encounter.   Current Outpatient Medications on File Prior to Encounter  Medication Sig Dispense Refill  . ibuprofen (ADVIL,MOTRIN) 200 MG tablet Take 200 mg by mouth every 6 (six) hours as needed. For pain      No Known Allergies  I have reviewed patient's Past Medical Hx, Surgical Hx, Family Hx, Social Hx, medications and allergies.   ROS:  Review of Systems  Constitutional: Negative for chills and fever.  Respiratory: Negative for shortness of breath.   Gastrointestinal: Positive for abdominal pain. Negative for constipation, diarrhea and nausea.  Genitourinary: Positive for pelvic pain and vaginal bleeding.  Musculoskeletal: Negative for back pain.  Neurological: Negative for dizziness and weakness.   Review of Systems  Other systems negative   Physical Exam  Physical Exam Patient Vitals for the past 24 hrs:  BP Temp Temp src Pulse Resp SpO2 Height Weight  09/03/19 0551 140/85 -- -- 74 -- -- -- --  09/03/19 0530 (!) 159/79 -- -- 78 -- -- -- --  09/03/19 0529 -- 98.6 F (37 C) -- -- 18 -- 5\' 5"  (1.651 m) 68.5 kg  09/03/19 0245 -- -- -- -- -- -- 5\' 5"  (1.651 m) 69.4 kg  09/03/19 0238 (!) 177/93 98.2 F (36.8 C) Oral 84 18 100 % -- --   Constitutional: Well-developed, well-nourished female in no acute distress.  Cardiovascular: normal rate Respiratory: normal effort GI: Abd soft, non-tender. Pos BS x 4 MS: Extremities nontender, no edema, normal ROM Neurologic: Alert and oriented x 4.  GU: Neg CVAT.  PELVIC EXAM: Cervix pink, visually  closed, without lesion, scant red discharge, vaginal walls and external genitalia normal Bimanual exam: Cervix 0/long/high, firm, anterior, neg CMT, uterus nontender, nonenlarged, adnexa without tenderness, enlargement, or mass   LAB RESULTS Results for orders placed or performed during the hospital encounter of 09/03/19 (from the past 24 hour(s))  Type and screen [place]     Status:  None   Collection Time: 09/03/19  2:50 AM  Result Value Ref Range   ABO/RH(D) O POS    Antibody Screen NEG    Sample Expiration      09/06/2019,2359 Performed at [facility], [place] [address], [place], [place] [address]   ABO/Rh     Status: None (Preliminary result)   Collection Time: 09/03/19  2:50 AM  Result Value Ref Range   ABO/RH(D)      O POS Performed at [facility] [address], [place], [place] [address]   Comprehensive metabolic panel     Status: Abnormal   Collection Time: 09/03/19  2:52 AM  Result Value Ref Range   Sodium 136 135 - 145 mmol/L   Potassium 3.9 3.5 - 5.1 mmol/L   Chloride 102 98 - 111 mmol/L   CO2 25 22 - 32 mmol/L   Glucose, Bld 103 (H) 70 - 99 mg/dL   BUN 15 6 - 20 mg/dL   Creatinine, Ser 0.74 0.44 - 1.00 mg/dL   Calcium 9.3 8.9 - 10.3 mg/dL   Total Protein 7.6 6.5 - 8.1 g/dL   Albumin 3.5 3.5 - 5.0 g/dL   AST 19 15 - 41 U/L   ALT 18 0 - 44 U/L   Alkaline Phosphatase 48 38 - 126 U/L   Total Bilirubin 0.3 0.3 - 1.2 mg/dL   GFR calc non Af Amer >60 >60 mL/min   GFR calc Af Amer >60 >60 mL/min   Anion gap 9 5 - 15  CBC     Status: None   Collection Time: 09/03/19  2:52 AM  Result Value Ref Range   WBC 9.1 4.0 - 10.5 K/uL   RBC 3.92 3.87 - 5.11 MIL/uL   Hemoglobin 12.6 12.0 - 15.0 g/dL   HCT 38.8 36.0 - 46.0 %   MCV 99.0 80.0 - 100.0 fL   MCH 32.1 26.0 - 34.0 pg   MCHC 32.5 30.0 - 36.0 g/dL   RDW 13.1 11.5 - 15.5 %   Platelets 332 150 - 400 K/uL   nRBC 0.0 0.0 - 0.2 %  I-Stat beta hCG blood, ED     Status: Abnormal    Collection Time: 09/03/19  3:03 AM  Result Value Ref Range   I-stat hCG, quantitative 629.1 (H) <5 mIU/mL   Comment 3            --/--/O POS, O POS Performed at [facility] [address] [place], [place] [address]  [phone])  IMAGING US OB LESS THAN 14 WEEKS WITH OB TRANSVAGINAL  Result Date: 09/03/2019 CLINICAL DATA:  Spontaneous abortion. Ultrasound 6 days ago showed nothing in the uterus or adnexa. Declining hCG from [id number] in 1 day. HCG no 600. Still passing clots. Rule out retained products of conception. EXAM: OBSTETRIC <14 WK [place] AND TRANSVAGINAL OB US TECHNIQUE: Both transabdominal and transvaginal ultrasound examinations were performed for complete evaluation of the gestation as well as the maternal uterus, adnexal regions, and pelvic cul-de-sac. Transvaginal technique was performed to assess early pregnancy. COMPARISON:  Obstetric ultrasound 08/27/2019 FINDINGS: Intrauterine gestational sac: None Yolk sac:  Not Visualized. Embryo:  Not Visualized. Cardiac Activity: Not Visualized. Subchorionic hemorrhage:  None visualized. Maternal uterus/adnexae: Uterus is anteverted. There multiple uterine fibroids, largest measuring 5.1 x 4.9 x 4.3 cm. No fluid in the endometrial canal. Endometrium is difficult to define given adjacent fibroids, however is not thickened. Neither ovary is visualized. Trace free fluid in  the pelvis. IMPRESSION: 1. No evidence of retained products of conception. Endometrium is thin. 2. Multiple uterine fibroids. 3. Neither ovary is visualized.  No adnexal mass. Electronically Signed   By: Keith Rake M.D.   On: 09/03/2019 06:42     MAU Management/MDM: ED ordered usual first trimester r/o ectopic labs.   Previous exam done in [facility] hospital Will check Ultrasound to rule out retained POC US showed no retained POCs.  Will recheck HCG in a week in clinic Rx Ibuprofen for pain   Treatments in MAU included ibuprofen which somewhat relieved her pain.    This bleeding/pain can represent a normal pregnancy with bleeding, spontaneous abortion or even an ectopic which can be life-threatening.  The process as listed above helps to determine which of these is present.    ASSESSMENT 1. SAB (spontaneous abortion)   2. SAB (spontaneous abortion)     PLAN Discharge home Plan to repeat HCG level in one week  in clinic   Pt stable at time of discharge. Encouraged to return here or to other Urgent Care/ED if she develops worsening of symptoms, increase in pain, fever, or other concerning symptoms.    Hansel Feinstein CNM, MSN Certified Nurse-Midwife 09/03/2019  6:15 AM

## 2019-09-03 NOTE — ED Triage Notes (Addendum)
Pt arrives POV for eval of abd cramping and ongoing vag bleeding w/ clots since experiencing a miscarriage last week on 12/11. Pt reports she has been experiencing severe lower abd cramping evey 5 minutes lasting one minute. Reports bleeding has slowed tonight, but still present. Hx of D &C w/ previous miscarriage per chart

## 2019-09-08 ENCOUNTER — Other Ambulatory Visit: Payer: Self-pay | Admitting: *Deleted

## 2019-09-08 DIAGNOSIS — O039 Complete or unspecified spontaneous abortion without complication: Secondary | ICD-10-CM

## 2019-09-09 ENCOUNTER — Other Ambulatory Visit: Payer: Self-pay

## 2019-09-09 DIAGNOSIS — O039 Complete or unspecified spontaneous abortion without complication: Secondary | ICD-10-CM

## 2019-09-09 NOTE — Progress Notes (Unsigned)
Patient reported headache to [place], phlebotomist and requested BP check. BP 151/82. Patient reports her blood pressures have been elevated since SAB. Advised she follow up with PCP for appt.

## 2019-09-10 LAB — BETA HCG QUANT (REF LAB): hCG Quant: 126 m[IU]/mL

## 2020-01-11 ENCOUNTER — Ambulatory Visit: Payer: Self-pay | Attending: Internal Medicine

## 2020-01-11 DIAGNOSIS — Z20822 Contact with and (suspected) exposure to covid-19: Secondary | ICD-10-CM | POA: Insufficient documentation

## 2020-01-12 LAB — NOVEL CORONAVIRUS, NAA: SARS-CoV-2, NAA: NOT DETECTED

## 2020-01-12 LAB — SARS-COV-2, NAA 2 DAY TAT

## 2020-05-02 ENCOUNTER — Other Ambulatory Visit: Payer: Self-pay

## 2020-05-02 ENCOUNTER — Telehealth: Payer: Self-pay

## 2020-05-02 DIAGNOSIS — N6320 Unspecified lump in the left breast, unspecified quadrant: Secondary | ICD-10-CM

## 2020-05-02 DIAGNOSIS — N644 Mastodynia: Secondary | ICD-10-CM

## 2020-05-02 DIAGNOSIS — Z1231 Encounter for screening mammogram for malignant neoplasm of breast: Secondary | ICD-10-CM

## 2020-05-02 NOTE — Telephone Encounter (Signed)
Patient called and requested to schedule a [facility] appointment for left breast lump/pain, no other symptoms. Patient scheduled with [facility] Thursday, May 12, 2020 @ 11:15am, informed will be scheduled for a separate, diagnostic mammogram with possible ultrasound, and results will be discussed same day with MD @ [facility] [place] clinic address provided.

## 2020-05-12 ENCOUNTER — Ambulatory Visit
Admission: RE | Admit: 2020-05-12 | Discharge: 2020-05-12 | Disposition: A | Discharge status: Home / Self Care | Payer: Self-pay | Source: Ambulatory Visit | Attending: Obstetrics and Gynecology | Admitting: Obstetrics and Gynecology

## 2020-05-12 ENCOUNTER — Ambulatory Visit: Payer: Self-pay | Admitting: *Deleted

## 2020-05-12 ENCOUNTER — Ambulatory Visit
Admission: RE | Admit: 2020-05-12 | Discharge: 2020-05-12 | Disposition: A | Discharge status: Home / Self Care | Payer: No Typology Code available for payment source | Source: Ambulatory Visit | Attending: Obstetrics and Gynecology | Admitting: Obstetrics and Gynecology

## 2020-05-12 ENCOUNTER — Other Ambulatory Visit: Payer: Self-pay

## 2020-05-12 VITALS — BP 132/96 | Temp 97.3°F | Wt 154.0 lb

## 2020-05-12 DIAGNOSIS — Z0000 Encounter for general adult medical examination without abnormal findings: Secondary | ICD-10-CM

## 2020-05-12 DIAGNOSIS — N63 Unspecified lump in unspecified breast: Secondary | ICD-10-CM

## 2020-05-12 DIAGNOSIS — N644 Mastodynia: Secondary | ICD-10-CM

## 2020-05-12 DIAGNOSIS — N6320 Unspecified lump in the left breast, unspecified quadrant: Secondary | ICD-10-CM

## 2020-05-12 DIAGNOSIS — Z1239 Encounter for other screening for malignant neoplasm of breast: Secondary | ICD-10-CM

## 2020-05-12 DIAGNOSIS — N6325 Unspecified lump in the left breast, overlapping quadrants: Secondary | ICD-10-CM

## 2020-05-12 DIAGNOSIS — N6315 Unspecified lump in the right breast, overlapping quadrants: Secondary | ICD-10-CM

## 2020-05-12 NOTE — Patient Instructions (Signed)
Explained breast self awareness with Joyce Garcia. Unable to complete patients Pap smear today due to patient is currently on menstrual period. Patient scheduled to have Pap smear at [facility] clinic on Monday, May 30, 2020 at 0930. Referred patient to the [facility] for a diagnostic mammogram. Appointment scheduled Thursday, May 12, 2020 at 1310. Patient aware of appointments and will be there. Joyce Garcia verbalized understanding.  Tamanna Whitson, [place], RN 11:14 AM

## 2020-05-12 NOTE — Progress Notes (Signed)
[name] is a 44 y.o. female who presents to [facility] clinic today with complaint of left breast lump and pain x 2 years. Patient stated the pain comes and goes. Patient rated the pain at a 4-5 out of 10.    Pap Smear: Pap smear not completed today. Last Pap smear was 07/30/2018 at [facility] and was normal with negative HPV. Per patient has history of an abnormal Pap smear 06/2008 that was ASCUS with positive HPV. Patients previous Pap smear 11/2007 was normal with positive HPV. 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 Last two Pap smear and colposcopy results is available in Epic.   Physical exam: Breasts Breasts symmetrical. No skin abnormalities bilateral breasts. No nipple retraction bilateral breasts. No nipple discharge bilateral breasts. No lymphadenopathy. Palpated a bb sized mobile lump within the left breast at 3 o'clock next to nipple. Palpated a bb sized lump within the right breast at 6 o'clock 1 cm from the nipple. No complaints of pain or tenderness on exam.   Pelvic/Bimanual Due to patients history of CIN-II and only two normal Pap smears. Patient is due for a Pap smear due to her history. Unable to complete patients Pap smear today because patient is currently on her menstrual period. Patient scheduled to have Pap smear at [facility] clinic on Monday, May 30, 2020 at 0930.   Smoking History: Patient has never smoked.   Patient Navigation: Patient education provided. Access to services provided for patient through [facility] program.    Breast and Cervical Cancer Risk Assessment: Patient does not have family history of breast cancer, known genetic mutations, or radiation treatment to the chest before age 64. Patient has history of cervical dysplasia. Patient has no history of being immunocompromised or DES exposure in-utero.  Risk Assessment    Risk Scores       05/12/2020   Last edited by: Loletta Parish, RN   5-year risk: 0.8 %   Lifetime risk: 7.1 %         A: [facility]CP exam without pap smear Complaint of left breast lump and pain.  P: Referred patient to the [facility] for a diagnostic mammogram. Appointment scheduled Thursday, May 12, 2020 at 1310.  Loletta Parish, RN 05/12/2020 11:13 AM

## 2020-05-30 ENCOUNTER — Other Ambulatory Visit: Payer: Self-pay

## 2020-05-30 ENCOUNTER — Other Ambulatory Visit: Payer: Self-pay | Admitting: Obstetrics and Gynecology

## 2020-05-30 ENCOUNTER — Inpatient Hospital Stay: Payer: Self-pay | Attending: Obstetrics and Gynecology | Admitting: *Deleted

## 2020-05-30 ENCOUNTER — Ambulatory Visit: Payer: No Typology Code available for payment source | Admitting: *Deleted

## 2020-05-30 VITALS — BP 114/84 | Ht 63.0 in | Wt 153.3 lb

## 2020-05-30 DIAGNOSIS — Z124 Encounter for screening for malignant neoplasm of cervix: Secondary | ICD-10-CM

## 2020-05-30 DIAGNOSIS — Z0000 Encounter for general adult medical examination without abnormal findings: Secondary | ICD-10-CM

## 2020-05-30 NOTE — Progress Notes (Signed)
[facility] initial screening     Clinical Measurement:  Vitals:   05/30/20 0945 05/30/20 1209  BP: 132/78 114/84   Fasting Labs Drawn Today, will review with patient when they result.   Medical History:  Patient states that she does not have high cholesterol, does not have high blood pressure and she does not have diabetes.  Medications:  Patient states that she does not take medication to lower cholesterol, blood pressure and blood sugar.  Patient does not take an aspirin a day to help prevent a heart attack or stroke.    Blood pressure, self measurement:  :  Patient states that she does measure blood pressure from home. She checks her blood pressure monthly. She shares her readings with a health care provider: no.   Nutrition: Patient states that on average she eats 1 cups of fruit and 3 cups of vegetables per day. Patient states that she does eat fish at least 2 times per week. Patient eats less than half servings of whole grains. Patient drinks less than 36 ounces of beverages with added sugar weekly: no. Patient is currently watching sodium or salt intake: yes. In the past 7 days patient has had 0 drinks containing alcohol. On average patient drinks 0 drinks containing alcohol per day.      Physical activity:  Patient states that she gets 0 minutes of moderate and 0 minutes of vigorous physical activity each week.  Smoking status:  Patient states that she has has never smoked .   Quality of life:  Over the past 2 weeks patient states that she had little interest or pleasure in doing things: not at all. She has been feeling down, depressed or hopeless:not at all.    Risk reduction and counseling:   Health Coaching: Spoke with patient about the daily recommendation for fruits and vegetables. Explained that the recommendation is for 2 cups of fruit and 3 cups of vegetables per day. We also spoke about the importance of whole grains in relation to heart health. Gave suggestions of whole  wheat bread or pasta, whole grain cereals, oatmeal or brown rice. I also explained that the recommendation is for 36 oz or less of beverages with added sugar weekly. Also encouraged patient to try and start daily exercise. Encouraged patient to try and start walking for 20 minutes a day.   Navigation:  I will notify patient of lab results.  Patient is aware of 2 more health coaching sessions and a follow up.  Time: 20 minutes

## 2020-05-30 NOTE — Progress Notes (Signed)
Patient: Joyce Garcia           Date of Birth: 04-15-1976           MRN: [id number] Visit Date: 05/30/2020 PCP: Antony Blackbird, MD     Cervical Exam  Abnormal Observations: Normal Exam Recommendations: Last Pap smear was 07/30/2018 at [facility] and was normal with negative HPV. Per patient has history of an abnormal Pap smear 06/2008 that was ASCUS with positive HPV. Patients previous Pap smear 11/2007 was normal with positive HPV. 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 Last two Pap smear and colposcopy results is available in Epic. If today's Pap smear is normal her next Pap smear will be due in one year due to she has only had one normal Pap smear since colposcopy.      Patient's History Patient Active Problem List   Diagnosis Date Noted  . GERD (gastroesophageal reflux disease) 12/20/2014  . 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 Seasonal allergies 12/20/2014  . Tooth sensitivity 12/20/2014  . 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 Low back pain 09/03/2012   Past Medical History:  Diagnosis Date  . Abnormal Pap smear    at [facility] prior to becoming pregnant, unsure of what was abnormal  . History of breast surgery    removed a lump    Family History  Problem Relation Age of Onset  . Hypertension Father   . Hypertension Sister   . Hypertension Sister   . Hypertension Sister     Social History   Occupational History  . Not on file  Tobacco Use  . Smoking status: Never Smoker  . Smokeless tobacco: Never Used  Vaping Use  . Vaping Use: Never used  Substance and Sexual Activity  . Alcohol use: No  . Drug use: No  . Sexual activity: Yes    Birth control/protection: None

## 2020-05-31 LAB — LIPID PANEL
Chol/HDL Ratio: 4 ratio (ref 0.0–4.4)
Cholesterol, Total: 212 mg/dL — ABNORMAL HIGH (ref 100–199)
HDL: 53 mg/dL (ref >39)
LDL Chol Calc (NIH): 146 mg/dL — ABNORMAL HIGH (ref 0–99)
Triglycerides: 72 mg/dL (ref 0–149)
VLDL Cholesterol Cal: 13 mg/dL (ref 5–40)

## 2020-05-31 LAB — HEMOGLOBIN A1C
Est. average glucose Bld gHb Est-mCnc: 108 mg/dL
Hgb A1c MFr Bld: 5.4 % (ref 4.8–5.6)

## 2020-05-31 LAB — GLUCOSE, RANDOM: Glucose: 84 mg/dL (ref 65–99)

## 2020-06-02 ENCOUNTER — Telehealth: Payer: Self-pay

## 2020-06-02 LAB — CYTOLOGY - PAP
Comment: NEGATIVE
Diagnosis: NEGATIVE
High risk HPV: NEGATIVE

## 2020-06-02 NOTE — Telephone Encounter (Addendum)
Spoke with patient about pap smear results. Informed patient that her pap was negative and the HPV was negative. Informed patient that her next pap smear will be due in 3-5 years based on her previous hx. Patient voiced understanding.

## 2020-06-06 ENCOUNTER — Telehealth: Payer: Self-pay

## 2020-06-06 NOTE — Telephone Encounter (Signed)
Health coaching 2     Labs- 212 cholesterol, 146 LDL cholesterol, 72 triglycerides, 53 HDL cholesterol, 5.4 hemoglobin A1C,  84 mean plasma glucose. Patient understands and is aware of her lab results.   Goals- Discussed lab results with patient and answered any questions that patient had regarding results.   1. Reduce the amount of fried and fatty foods consumed. (Grill, bake, broil or sautee foods instead.) 2. Reduce the amount of red meat consumed. (Substiute for lean proteins like chicken and fish.) 3. Reduce the amount of full fat dairy products consumed. Substitute for reduce-fat or fat-free options. 4. Add in more whole grains and heart healthy fish in diet. 5. Try and start exercising daily. With a goal of 20 minutes per day.   Navigation:  Patient is aware of 1 more health coaching sessions and a follow up. Patient is scheduled for follow-up visit with [facility] on Tuesday, October 12th @ 9:00 am.  Time- 12 minutes

## 2020-06-27 NOTE — Progress Notes (Signed)
Patient ID: Joyce Garcia, female    DOB: [birth year]  MRN: [id number]  CC: Cholesterol Follow-Up   Subjective: Joyce Garcia is a 44 y.o. female with history of gastroesophageal reflux disease, cervical intraepithelial neoplasia II, low back pain, and decreased visual acuity who presents for cholesterol follow-up.   1. CHOLESTEROL FOLLOW-UP: 06/06/2020: Visit with [facility]. During that encounter labs: Cholesterol 212 LDL 146 Triglycerides 72 HDL 53 Hemoglobin A1C 5.4 Mean plasma glucose 84  06/28/2020: Does not want to start taking cholesterol medication. Prefers natural remedies. Using bay leaves making into tea and drinking twice daily, began 3 days ago. Walking sometimes for exercise.   Last Lipid Panel results:  HDL  Date Value Ref Range Status  05/30/2020 53 >39 mg/dL Final   Triglycerides  Date Value Ref Range Status  05/30/2020 72 0 - 149 mg/dL Final     Patient Active Problem List   Diagnosis Date Noted  . GERD (gastroesophageal reflux disease) 12/20/2014  . 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 Seasonal allergies 12/20/2014  . Tooth sensitivity 12/20/2014  . 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 Low back pain 09/03/2012     Current Outpatient Medications on File Prior to Visit  Medication Sig Dispense Refill  . ibuprofen (ADVIL) 600 MG tablet Take 1 tablet (600 mg total) by mouth every 6 (six) hours as needed. 30 tablet 1   No current facility-administered medications on file prior to visit.    No Known Allergies  Social History   Socioeconomic History  . Marital status: Single    Spouse name: Not on file  . Number of children: Not on file  . Years of education: Not on file  . Highest education level: Not on file  Occupational History  . Not on file  Tobacco Use  . Smoking status: Never Smoker  . Smokeless tobacco: Never Used  Vaping Use  . Vaping Use: Never used  Substance and Sexual Activity    . Alcohol use: No  . Drug use: No  . Sexual activity: Yes    Birth control/protection: None  Other Topics Concern  . Not on file  Social History Narrative   With boyfriend x 10 years.  Has 4 children.  2 from this marriage and 2 from previous marriage. [place] came to [place] in [place].    Social Determinants of Health   Financial Resource Strain:   . Difficulty of Paying Living Expenses: Not on file  Food Insecurity:   . Worried About Charity fundraiser in the Last Year: Not on file  . Ran Out of Food in the Last Year: Not on file  Transportation Needs: No Transportation Needs  . Lack of Transportation (Medical): No  . Lack of Transportation (Non-Medical): No  Physical Activity:   . Days of Exercise per Week: Not on file  . Minutes of Exercise per Session: Not on file  Stress:   . Feeling of Stress : Not on file  Social Connections:   . Frequency of Communication with Friends and Family: Not on file  . Frequency of Social Gatherings with Friends and Family: Not on file  . Attends Religious Services: Not on file  . Active Member of Clubs or Organizations: Not on file  . Attends Archivist Meetings: Not on file  . Marital Status: Not on file  Intimate Partner Violence:   . Fear of Current or Ex-Partner: Not on file  . Emotionally Abused:  Not on file  . Physically Abused: Not on file  . Sexually Abused: Not on file    Family History  Problem Relation Age of Onset  . Hypertension Father   . Hypertension Sister   . Hypertension Sister   . Hypertension Sister     Past Surgical History:  Procedure Laterality Date  . BREAST EXCISIONAL BIOPSY Left 1996  . BREAST SURGERY     had lump removed  . DILATION AND CURETTAGE OF UTERUS     miscarriage    ROS: Review of Systems Negative except as stated above  PHYSICAL EXAM: Vitals with BMI 06/28/2020 05/30/2020 05/30/2020  Height 5\' 5"  - 5\' 3"   Weight 153 lbs - 153 lbs 5 oz  BMI 21.19 - 41.74  Systolic 081  448 185  Diastolic 91 84 78  Pulse 71 - -    Physical Exam General appearance - alert, well appearing, and in no distress and oriented to person, place, and time Mental status - alert, oriented to person, place, and time, normal mood, behavior, speech, dress, motor activity, and thought processes Neck - supple, no significant adenopathy Lymphatics - no palpable lymphadenopathy, no hepatosplenomegaly Chest - clear to auscultation, no wheezes, rales or rhonchi, symmetric air entry, no tachypnea, retractions or cyanosis Heart - normal rate, regular rhythm, normal S1, S2, no murmurs, rubs, clicks or gallops Neurological - alert, oriented, normal speech, no focal findings or movement disorder noted, neck supple without rigidity, cranial nerves II through XII intact, motor and sensory grossly normal bilaterally, normal muscle tone, no tremors, strength 5/5  The 10-year ASCVD risk score Mikey Bussing DC Jr., et al., 2013) is: 3.4%   Values used to calculate the score:     Age: 33 years     Sex: Female     Is Non-Hispanic African American: Yes     Diabetic: No     Tobacco smoker: No     Systolic Blood Pressure: 631 mmHg     Is BP treated: No     HDL Cholesterol: 53 mg/dL     Total Cholesterol: 212 mg/dL  ASSESSMENT AND PLAN: 1. Hyperlipidemia, unspecified hyperlipidemia type: - Patient does not want to begin cholesterol medication. Prefers natural remedies. Using bay leaves making into tea and drinking twice daily, began 3 days ago. Walking sometimes for exercise.  - Practice low-fat heart healthy diet and at least 150 minutes of moderate intensity exercise weekly as tolerated. - Counseled that high cholesterol may increase risks of heart attack and stroke. Patient verbalized understanding. - Lipid panel obtained on 05/30/2020. Total cholesterol 212 and LDL cholesterol 146 at that time. 10-year ASCVD risk score 3.4%. - Blood pressure elevated during today's visit. Patient asymptomatic without chest  pressure, chest pain, palpitations, and shortness of breath. Reports she has anxiety about being at the provider's office. Says blood pressures at home are around 120-130/70-80. - Follow-up with primary physician in 6 to 8 weeks or sooner if needed for evaluation and management. Repeat lipid panel may be considered at that time.  Patient was given the opportunity to ask questions.  Patient verbalized understanding of the plan and was able to repeat key elements of the plan. Patient was given clear instructions to go to Emergency Department or return to medical center if symptoms don't improve, worsen, or new problems develop.The patient verbalized understanding.  Return in about 7 weeks (around 08/16/2020) for Dr. Chapman Fitch.  Camillia Herter, NP

## 2020-06-28 ENCOUNTER — Other Ambulatory Visit: Payer: Self-pay

## 2020-06-28 ENCOUNTER — Ambulatory Visit: Payer: Self-pay | Attending: Family | Admitting: Family

## 2020-06-28 ENCOUNTER — Encounter: Payer: Self-pay | Admitting: Family

## 2020-06-28 VITALS — BP 157/91 | HR 71 | Temp 97.0°F | Ht 65.0 in | Wt 153.0 lb

## 2020-06-28 DIAGNOSIS — E785 Hyperlipidemia, unspecified: Secondary | ICD-10-CM

## 2020-06-28 NOTE — Patient Instructions (Addendum)
Follow-up in 6 to 8 weeks or sooner if needed with primary physician for cholesterol management.  Preventing High Cholesterol Cholesterol is a white, waxy substance similar to fat that the human body needs to help build cells. The liver makes all the cholesterol that a person's body needs. Having high cholesterol (hypercholesterolemia) increases a person's risk for heart disease and stroke. Extra (excess) cholesterol comes from the food the person eats. High cholesterol can often be prevented with diet and lifestyle changes. If you already have high cholesterol, you can control it with diet and lifestyle changes and with medicine. How can high cholesterol affect me? If you have high cholesterol, deposits (plaques) may build up on the walls of your arteries. The arteries are the blood vessels that carry blood away from your heart. Plaques make the arteries narrower and stiffer. This can limit or block blood flow and cause blood clots to form. Blood clots:  Are tiny balls of cells that form in your blood.  Can move to the heart or brain, causing a heart attack or stroke. Plaques in arteries greatly increase your risk for heart attack and stroke.Making diet and lifestyle changes can reduce your risk for these conditions that may threaten your life. What can increase my risk? This condition is more likely to develop in people who:  Eat foods that are high in saturated fat or cholesterol. Saturated fat is mostly found in: ? Foods that contain animal fat, such as red meat and some dairy products. ? Certain fatty foods made from plants, such as tropical oils.  Are overweight.  Are not getting enough exercise.  Have a family history of high cholesterol. What actions can I take to prevent this? Nutrition   Eat less saturated fat.  Avoid trans fats (partially hydrogenated oils). These are often found in margarine and in some baked goods, fried foods, and snacks bought in packages.  Avoid  precooked or cured meat, such as sausages or meat loaves.  Avoid foods and drinks that have added sugars.  Eat more fruits, vegetables, and whole grains.  Choose healthy sources of protein, such as fish, poultry, lean cuts of red meat, beans, peas, lentils, and nuts.  Choose healthy sources of fat, such as: ? Nuts. ? Vegetable oils, especially olive oil. ? Fish that have healthy fats (omega-3 fatty acids), such as mackerel or salmon. The items listed above may not be a complete list of recommended foods and beverages. Contact a dietitian for more information. Lifestyle  Lose weight if you are overweight. Losing 5-10 lb (2.3-4.5 kg) can help prevent or control high cholesterol. It can also lower your risk for diabetes and high blood pressure. Ask your health care provider to help you with a diet and exercise plan to lose weight safely.  Do not use any products that contain nicotine or tobacco, such as cigarettes, e-cigarettes, and chewing tobacco. If you need help quitting, ask your health care provider.  Limit your alcohol intake. ? Do not drink alcohol if:  Your health care provider tells you not to drink.  You are pregnant, may be pregnant, or are planning to become pregnant. ? If you drink alcohol:  Limit how much you use to:  0-1 drink a day for women.  0-2 drinks a day for men.  Be aware of how much alcohol is in your drink. In the [place] one drink equals one 12 oz bottle of beer (355 mL), one 5 oz glass of wine (148 mL), or one 1  oz glass of hard liquor (44 mL). Activity   Get enough exercise. Each week, do at least 150 minutes of exercise that takes a medium level of effort (moderate-intensity exercise). ? This is exercise that:  Makes your heart beat faster and makes you breathe harder than usual.  Allows you to still be able to talk. ? You could exercise in short sessions several times a day or longer sessions a few times a week. For example, on 5 days each week,  you could walk fast or ride your bike 3 times a day for 10 minutes each time.  Do exercises as told by your health care provider. Medicines  In addition to diet and lifestyle changes, your health care provider may recommend medicines to help lower cholesterol. This may be a medicine to lower the amount of cholesterol your liver makes. You may need medicine if: ? Diet and lifestyle changes do not lower your cholesterol enough. ? You have high cholesterol and other risk factors for heart disease or stroke.  Take over-the-counter and prescription medicines only as told by your health care provider. General information  Manage your risk factors for high cholesterol. Talk with your health care provider about all your risk factors and how to lower your risk.  Manage other conditions that you have, such as diabetes or high blood pressure (hypertension).  Have blood tests to check your cholesterol levels at regular points in time as told by your health care provider.  Keep all follow-up visits as told by your health care provider. This is important. Where to find more information  American Heart Association: www.heart.org  National Heart, Lung, and Blood Institute: https://wilson-eaton.com/ Summary  High cholesterol increases your risk for heart disease and stroke. By keeping your cholesterol level low, you can reduce your risk for these conditions.  High cholesterol can often be prevented with diet and lifestyle changes.  Work with your health care provider to manage your risk factors, and have your blood tested regularly. This information is not intended to replace advice given to you by your health care provider. Make sure you discuss any questions you have with your health care provider. Document Revised: 12/26/2018 Document Reviewed: 05/12/2016 Elsevier Patient Education  2020 [employer].

## 2020-08-03 ENCOUNTER — Ambulatory Visit: Payer: No Typology Code available for payment source | Admitting: Family Medicine

## 2020-08-04 ENCOUNTER — Other Ambulatory Visit: Payer: Self-pay | Admitting: Physician Assistant

## 2020-08-04 ENCOUNTER — Encounter: Payer: Self-pay | Admitting: Physician Assistant

## 2020-08-04 ENCOUNTER — Other Ambulatory Visit: Payer: Self-pay

## 2020-08-04 ENCOUNTER — Ambulatory Visit: Payer: No Typology Code available for payment source | Attending: Family Medicine | Admitting: Physician Assistant

## 2020-08-04 VITALS — BP 162/95 | HR 81 | Wt 152.6 lb

## 2020-08-04 DIAGNOSIS — H6981 Other specified disorders of Eustachian tube, right ear: Secondary | ICD-10-CM

## 2020-08-04 DIAGNOSIS — H6501 Acute serous otitis media, right ear: Secondary | ICD-10-CM

## 2020-08-04 MED ORDER — FLUTICASONE PROPIONATE 50 MCG/ACT NA SUSP: 2.0000 | Freq: Every day | NASAL | 6 refills | Status: DC

## 2020-08-04 MED ORDER — AMOXICILLIN 500 MG PO CAPS: 1000.0000 mg | ORAL_CAPSULE | Freq: Two times a day (BID) | ORAL | 0 refills | Status: DC

## 2020-08-04 MED ORDER — METHYLPREDNISOLONE SODIUM SUCC 125 MG IJ SOLR
125.0000 mg | Freq: Once | INTRAMUSCULAR | Status: AC
  Administered 2020-08-04: 125 mg via INTRAMUSCULAR

## 2020-08-04 MED ORDER — FLUCONAZOLE 150 MG PO TABS: 150.0000 mg | ORAL_TABLET | Freq: Once | ORAL | 0 refills | Status: DC

## 2020-08-04 MED FILL — AMOXICILLIN 500 MG CAPSULE: 500 | 10 days supply | Qty: 40 | Fill #0

## 2020-08-04 MED FILL — FLUTICASONE PROP 50 MCG SPR: 50 | 30 days supply | Qty: 16 | Fill #0

## 2020-08-04 MED FILL — FLUCONAZOLE 150 MG TABLET: 150 | 1 days supply | Qty: 1 | Fill #0

## 2020-08-04 NOTE — Progress Notes (Signed)
Joyce Garcia, is a 44 y.o. female  YQM:[id number]  BMW:[id number]  DOB - [birth year]  Subjective:  Chief Complaint and HPI: Joyce Garcia is a 44 y.o. female here today for R ear pain and pressure for about 1 week.  Feels like her ear is clogged.  No fever.  No URI.    Glucose 84 on 05/30/2020  ED/Hospital notes reviewed.   Social History: Family history:  ROS:   Constitutional:  No f/c, No night sweats, No unexplained weight loss. EENT:  No vision changes, No blurry vision, No mouth, throat problems.  Respiratory: No cough, No SOB Cardiac: No CP, no palpitations GI:  No abd pain, No N/V/D. GU: No Urinary s/sx Musculoskeletal: No joint pain Neuro: No headache, no dizziness, no motor weakness.  Skin: No rash Endocrine:  No polydipsia. No polyuria.  Psych: Denies SI/HI  No problems updated.  ALLERGIES: No Known Allergies  PAST MEDICAL HISTORY: Past Medical History:  Diagnosis Date   Abnormal Pap smear    at HD prior to becoming pregnant, unsure of what was abnormal   History of breast surgery    removed a lump    MEDICATIONS AT HOME: Prior to Admission medications   Medication Sig Start Date End Date Taking? Authorizing Provider  amoxicillin (AMOXIL) 500 MG capsule Take 2 capsules (1,000 mg total) by mouth 2 (two) times daily. 08/04/20   Argentina Donovan, PA-C  fluconazole (DIFLUCAN) 150 MG tablet Take 1 tablet (150 mg total) by mouth once for 1 dose. After antibiotics if needed for yeast infection 08/04/20 08/04/20  Argentina Donovan, PA-C  fluticasone [facility]) 50 MCG/ACT nasal spray Place 2 sprays into both nostrils daily. 08/04/20   Argentina Donovan, PA-C  ibuprofen (ADVIL) 600 MG tablet Take 1 tablet (600 mg total) by mouth every 6 (six) hours as needed. Patient not taking: Reported on 08/04/2020 09/03/19   Seabron Spates, CNM     Objective:  EXAM:   Vitals:   08/04/20 1042  BP: (!) 162/95  Pulse: 81  SpO2: 98%  Weight: 152 lb  9.6 oz (69.2 kg)    General appearance : A&OX3. NAD. Non-toxic-appearing HEENT: Atraumatic and Normocephalic.  PERRLA. EOM intact.  R TM full and cloudy with distorted TM and a few "bubbles" behind TM.  Pale erythema of R ear.  Neck: supple, no JVD. No cervical lymphadenopathy. No thyromegaly Chest/Lungs:  Breathing-non-labored, Good air entry bilaterally, breath sounds normal without rales, rhonchi, or wheezing  CVS: S1 S2 regular, no murmurs, gallops, rubs  Extremities: Bilateral Lower Ext shows no edema, both legs are warm to touch with = pulse throughout Neurology:  CN II-XII grossly intact, Non focal.   Psych:  TP linear. J/I WNL. Normal speech. Appropriate eye contact and affect.  Skin:  No Rash  Data Review Lab Results  Component Value Date   HGBA1C 5.4 05/30/2020     Assessment & Plan   1. Non-recurrent acute serous otitis media of right ear Avoid ear plugs - methylPREDNISolone sodium succinate (SOLU-MEDROL) 125 mg/2 mL injection 125 mg - amoxicillin (AMOXIL) 500 MG capsule; Take 2 capsules (1,000 mg total) by mouth 2 (two) times daily.  Dispense: 40 capsule; Refill: 0 - fluticasone (FLONASE) 50 MCG/ACT nasal spray; Place 2 sprays into both nostrils daily.  Dispense: 16 g; Refill: 6 - fluconazole (DIFLUCAN) 150 MG tablet; Take 1 tablet (150 mg total) by mouth once for 1 dose. After antibiotics if needed for yeast infection  Dispense: 1 tablet; Refill:  0  2. Dysfunction of right eustachian tube - methylPREDNISolone sodium succinate (SOLU-MEDROL) 125 mg/2 mL injection 125 mg - fluticasone (FLONASE) 50 MCG/ACT nasal spray; Place 2 sprays into both nostrils daily.  Dispense: 16 g; Refill: 6   Patient have been counseled extensively about nutrition and exercise  Return if symptoms worsen or fail to improve.  The patient was given clear instructions to go to ER or return to medical center if symptoms don't improve, worsen or new problems develop. The patient verbalized  understanding. The patient was told to call to get lab results if they haven't heard anything in the next week.     Freeman Caldron, PA-C [facility] and [place], [place]   08/04/2020, 11:01 AMPatient ID: Joyce Garcia, female   DOB: 04/05/1976, 44 y.o.   MRN: [id number]

## 2020-08-04 NOTE — Patient Instructions (Signed)
Ear Barotrauma, Adult  Ear barotrauma is inflammation of the middle ear. This condition occurs when an auditory tube (eustachian tube) is blocked in one or both ears. The eustachian tubes lead from the middle ear to the back of the nose (nasopharynx). This condition typically occurs when you experience changes in air pressure, such as when flying or scuba diving. Untreated ear barotrauma may lead to ear damage or hearing loss, which can become permanent. What are the causes? The pressure difference that causes ear barotrauma can result from various things, including:  Flying in an airplane.  Descending or coming to the surface too quickly when scuba diving.  Going to higher altitudes quickly.  Being too close to an explosion or blast. What increases the risk? The following factors may make you more likely to develop this condition:  A middle ear infection, sinus infection, or a cold.  Environmental allergies.  Small eustachian tubes.  Recent ear surgery. What are the signs or symptoms? Symptoms of this condition include:  Ear pain.  Sudden partial or complete loss of hearing.  Feeling that your ear is clogged.  Dizziness that creates a spinning, rocking, or tumbling sensation (vertigo).  Loss of balance.  Nausea.  Ringing in your ears.  Bleeding from your ears.  Headache.  Facial pain. How is this diagnosed? This condition may be diagnosed based on:  A physical exam. Your health care provider may: ? Use a device (otoscope) to look into your ear canal and check the eardrum. ? Do a test that changes air pressure in the middle ear (tympanogram). This test checks how well the eardrum and eustachian tube are working.  Your medical history and your symptoms.  A hearing test. You may be referred to a health care provider who specializes in treating ear conditions (otolaryngologist, or "ENT"). How is this treated? This condition may be treated with:  Medicines to  relieve stuffiness (congestion) in your nose, sinus, or upper respiratory tract (decongestants).  Techniques to "pop" your ears (equalize pressure), such as: ? Yawning. ? Chewing gum. ? Swallowing. ? Holding your nose closed and gently blowing.  Surgery to relieve symptoms or prevent future inflammation. This is done in severe cases. Follow these instructions at home:  Take over-the-counter and prescription medicines only as told by your health care provider.  Use techniques to equalize pressure as told by your health care provider.  Do not put anything into your ears to clean or unplug them. Ear drops will not help.  Do not do any of the following until your health care provider approves: ? Travel to high altitudes. ? Fly in an airplane. ? Scuba dive.  Keep all follow-up visits as told by your health care provider. This is important. How is this prevented? Using these strategies may help to prevent ear barotrauma:  Avoid exposure to pressure changes when you have symptoms of a cold or congestion.  If you have congestion and will be flying, use a nasal decongestant about 30-60 minutes before flying.  When flying, chew gum and swallow frequently and forcefully during takeoff and landing.  Hold your nose closed and gently blow to "pop" your ears. This forces air into the eustachian tubes and equalizes pressure.  Yawn during air pressure changes.  If scuba diving, dive feet first and equalize pressure often as you go deeper in the water. Contact a health care provider if:  Your symptoms get worse or do not get better.  You have vertigo.  You have hearing  loss.  You have a fever. Get help right away if:  You have severe ear pain.  You have a severe headache.  You have severe dizziness.  You have bloody or pus-like drainage from your ears.  You have balance problems.  You cannot move or feel part of your face. Summary  Ear barotrauma is inflammation of the  middle ear.  This condition typically occurs when you experience changes in pressure, such as when flying or scuba diving.  You may be at a higher risk for this condition if you have small eustachian tubes, had recent ear surgery, or have allergies, a cold, or a sinus or middle ear infection.  This condition may be treated with medicines or techniques to "pop" your ears (equalize pressure).  Strategies can be used to help prevent ear barotrauma. This information is not intended to replace advice given to you by your health care provider. Make sure you discuss any questions you have with your health care provider. Document Revised: 08/16/2017 Document Reviewed: 12/14/2016 Elsevier Patient Education  [place].    Eustachian Tube Dysfunction  Eustachian tube dysfunction refers to a condition in which a blockage develops in the narrow passage that connects the middle ear to the back of the nose (eustachian tube). The eustachian tube regulates air pressure in the middle ear by letting air move between the ear and nose. It also helps to drain fluid from the middle ear space. Eustachian tube dysfunction can affect one or both ears. When the eustachian tube does not function properly, air pressure, fluid, or both can build up in the middle ear. What are the causes? This condition occurs when the eustachian tube becomes blocked or cannot open normally. Common causes of this condition include:  Ear infections.  Colds and other infections that affect the nose, mouth, and throat (upper respiratory tract).  Allergies.  Irritation from cigarette smoke.  Irritation from stomach acid coming up into the esophagus (gastroesophageal reflux). The esophagus is the tube that carries food from the mouth to the stomach.  Sudden changes in air pressure, such as from descending in an airplane or scuba diving.  Abnormal growths in the nose or throat, such as: ? Growths that line the nose (nasal  polyps). ? Abnormal growth of cells (tumors). ? Enlarged tissue at the back of the throat (adenoids). What increases the risk? You are more likely to develop this condition if:  You smoke.  You are overweight.  You are a child who has: ? Certain birth defects of the mouth, such as cleft palate. ? Large tonsils or adenoids. What are the signs or symptoms? Common symptoms of this condition include:  A feeling of fullness in the ear.  Ear pain.  Clicking or popping noises in the ear.  Ringing in the ear.  Hearing loss.  Loss of balance.  Dizziness. Symptoms may get worse when the air pressure around you changes, such as when you travel to an area of high elevation, fly on an airplane, or go scuba diving. How is this diagnosed? This condition may be diagnosed based on:  Your symptoms.  A physical exam of your ears, nose, and throat.  Tests, such as those that measure: ? The movement of your eardrum (tympanogram). ? Your hearing (audiometry). How is this treated? Treatment depends on the cause and severity of your condition.  In mild cases, you may relieve your symptoms by moving air into your ears. This is called "popping the ears."  In  more severe cases, or if you have symptoms of fluid in your ears, treatment may include: ? Medicines to relieve congestion (decongestants). ? Medicines that treat allergies (antihistamines). ? Nasal sprays or ear drops that contain medicines that reduce swelling (steroids). ? A procedure to drain the fluid in your eardrum (myringotomy). In this procedure, a small tube is placed in the eardrum to:  Drain the fluid.  Restore the air in the middle ear space. ? A procedure to insert a balloon device through the nose to inflate the opening of the eustachian tube (balloon dilation). Follow these instructions at home: Lifestyle  Do not do any of the following until your health care provider approves: ? Travel to high altitudes. ? Fly  in airplanes. ? Work in a Pension scheme manager or room. ? Scuba dive.  Do not use any products that contain nicotine or tobacco, such as cigarettes and e-cigarettes. If you need help quitting, ask your health care provider.  Keep your ears dry. Wear fitted earplugs during showering and bathing. Dry your ears completely after. General instructions  Take over-the-counter and prescription medicines only as told by your health care provider.  Use techniques to help pop your ears as recommended by your health care provider. These may include: ? Chewing gum. ? Yawning. ? Frequent, forceful swallowing. ? Closing your mouth, holding your nose closed, and gently blowing as if you are trying to blow air out of your nose.  Keep all follow-up visits as told by your health care provider. This is important. Contact a health care provider if:  Your symptoms do not go away after treatment.  Your symptoms come back after treatment.  You are unable to pop your ears.  You have: ? A fever. ? Pain in your ear. ? Pain in your head or neck. ? Fluid draining from your ear.  Your hearing suddenly changes.  You become very dizzy.  You lose your balance. Summary  Eustachian tube dysfunction refers to a condition in which a blockage develops in the eustachian tube.  It can be caused by ear infections, allergies, inhaled irritants, or abnormal growths in the nose or throat.  Symptoms include ear pain, hearing loss, or ringing in the ears.  Mild cases are treated with maneuvers to unblock the ears, such as yawning or ear popping.  Severe cases are treated with medicines. Surgery may also be done (rare). This information is not intended to replace advice given to you by your health care provider. Make sure you discuss any questions you have with your health care provider. Document Revised: 12/24/2017 Document Reviewed: 12/24/2017 Elsevier Patient Education  [place].

## 2020-09-05 ENCOUNTER — Ambulatory Visit: Payer: No Typology Code available for payment source | Admitting: Family Medicine

## 2020-09-08 ENCOUNTER — Ambulatory Visit: Payer: No Typology Code available for payment source | Admitting: Family Medicine

## 2020-10-05 ENCOUNTER — Ambulatory Visit: Payer: No Typology Code available for payment source | Admitting: Nurse Practitioner

## 2020-11-22 ENCOUNTER — Ambulatory Visit: Payer: No Typology Code available for payment source | Admitting: Nurse Practitioner

## 2020-12-22 ENCOUNTER — Ambulatory Visit: Payer: No Typology Code available for payment source | Admitting: Physician Assistant

## 2022-05-26 IMAGING — US US BREAST*R* LIMITED INC AXILLA
1 series · 6 of 6 positions shown · non-contrast
Comparison: None.

CLINICAL DATA: Mass with mild intermittent tenderness in the upper
outer left breast anteriorly for the past 2 years. The patient
reports no change in size of the mass during that time. Mass felt on
recent physical examination in the 6 o'clock position of the right
breast. No family history of breast cancer.

EXAM:
DIGITAL DIAGNOSTIC BILATERAL MAMMOGRAM WITH CAD AND TOMO
ULTRASOUND BILATERAL BREAST

[Series 1: us breast*right* limited inc axilla · 0.06mm/px · 6 of 6 slices shown]
[im 1/6]
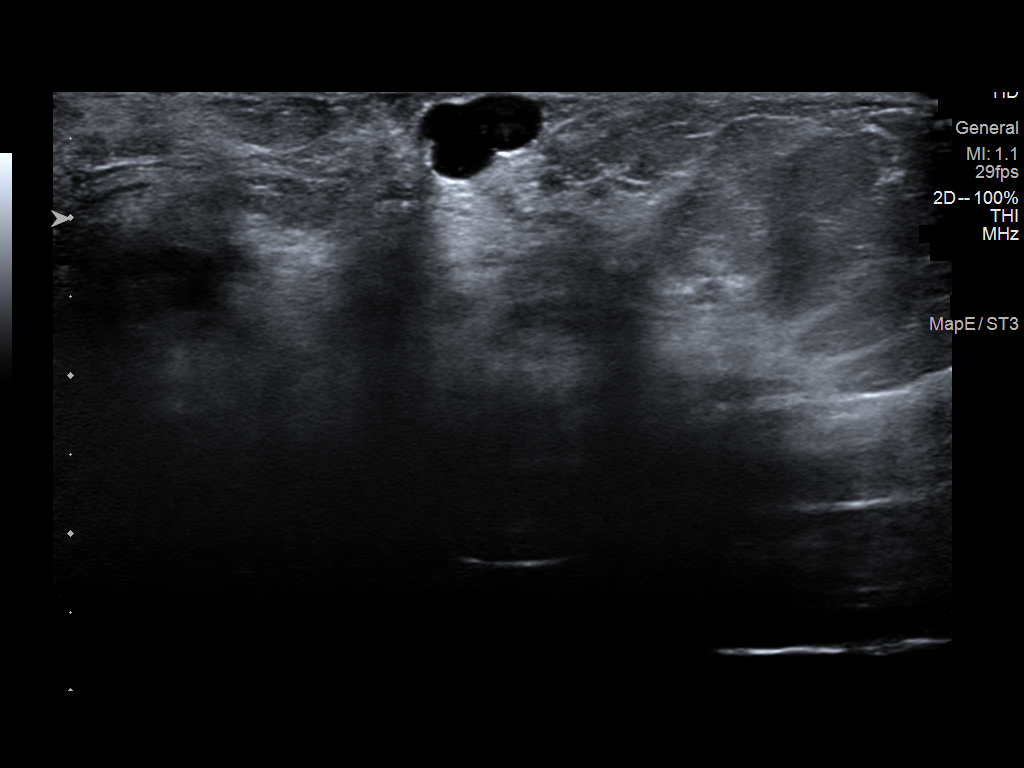
[im 2/6]
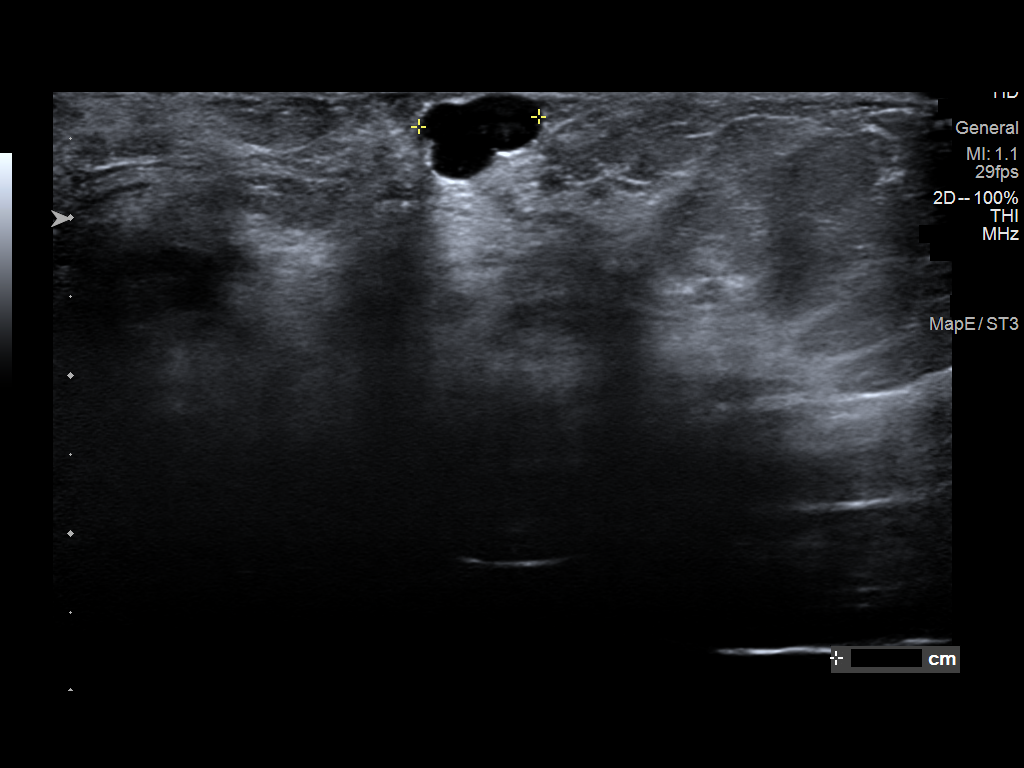
[im 3/6]
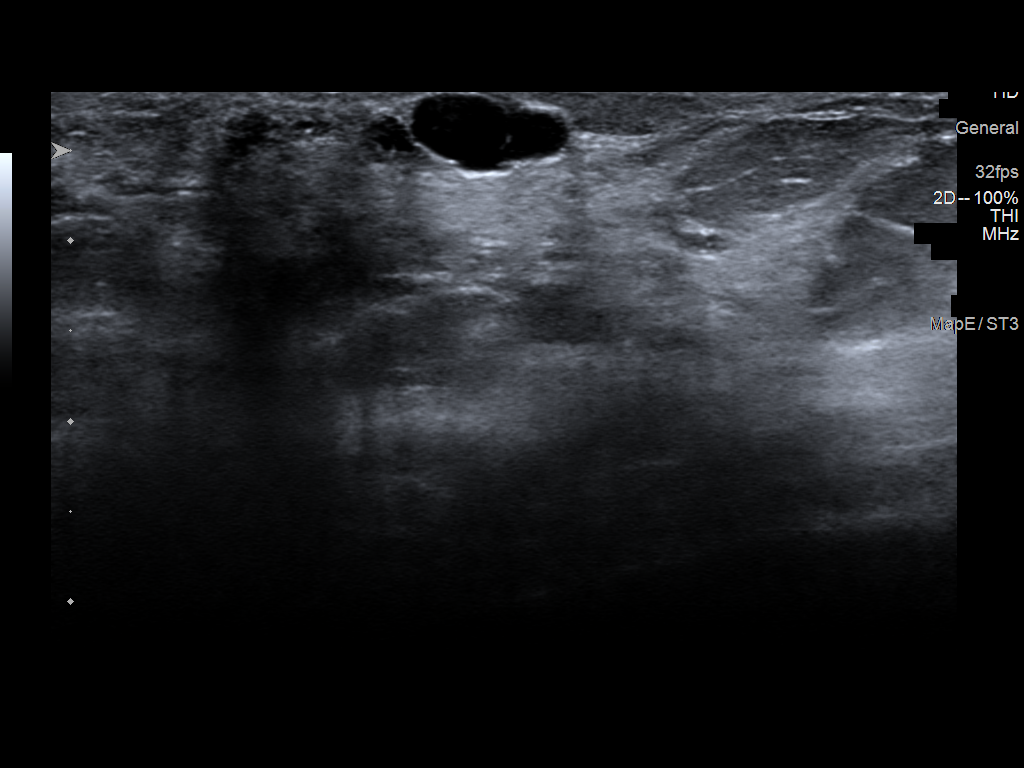
[im 4/6]
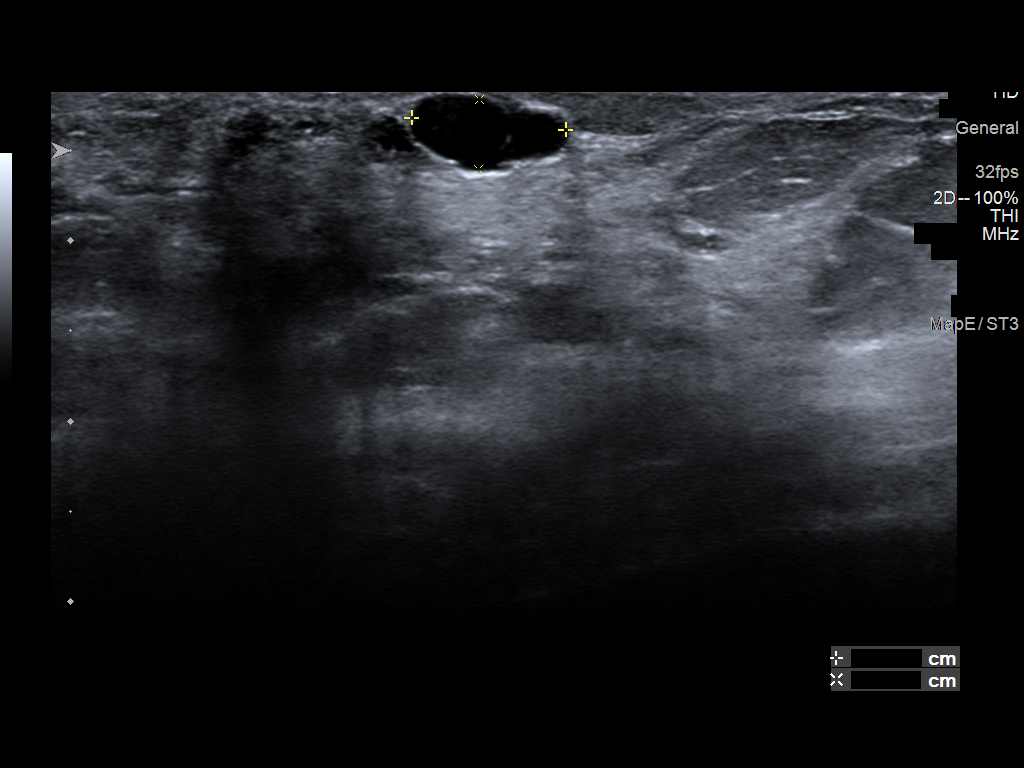
[im 5/6]
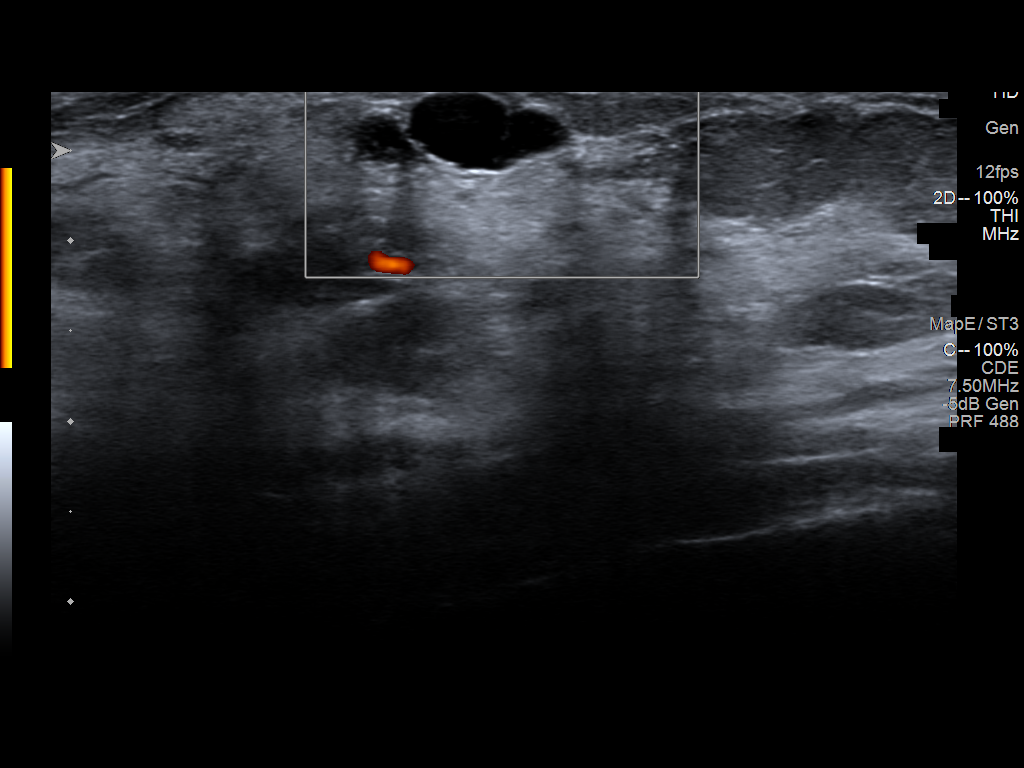
[im 6/6]
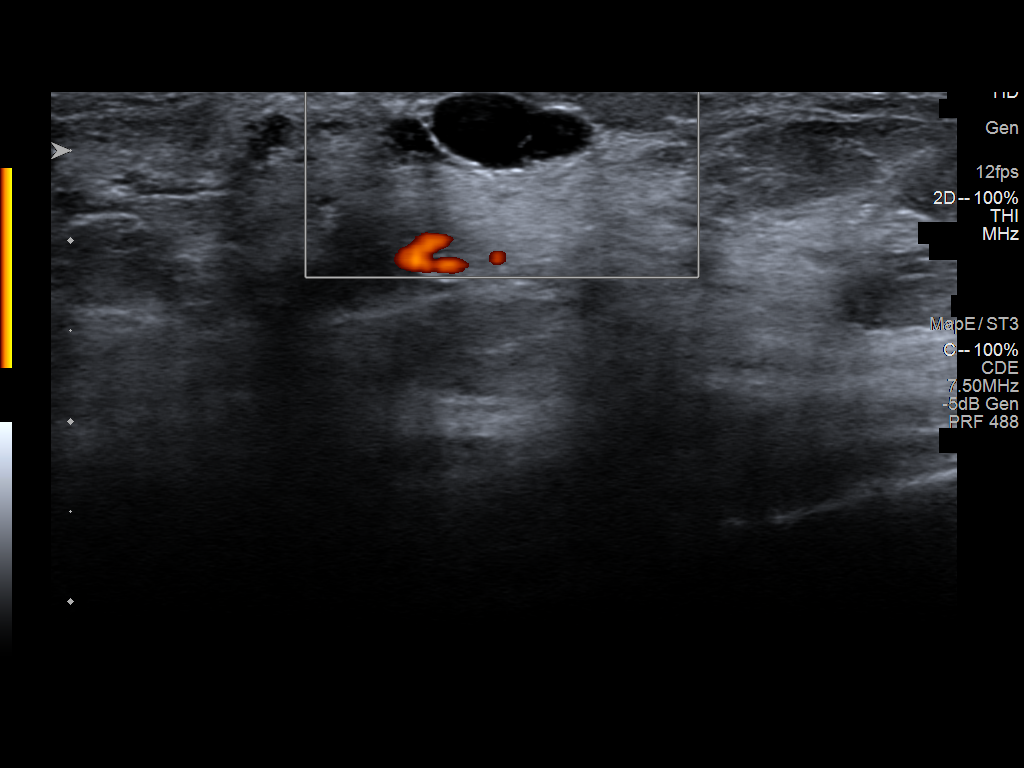

[6 of 6 positions shown; findings below may reference images not displayed]

ACR Breast Density Category c: The breast tissue is heterogeneously
dense, which may obscure small masses.
FINDINGS: There is a small oval mass in the 6 o'clock position of the right
breast just beneath the skin, at the location of the mass recently
felt on physical examination. No abnormalities demonstrated
elsewhere in either breast. Specifically, no abnormalities are seen
at the location of the area of patient concern on the left, marked
with a metallic marker.

Mammographic images were processed with CAD.

On physical exam, no mass is palpable in the outer retroareolar left
breast today.

Targeted ultrasound is performed, showing multiple subcentimeter
cysts in the outer retroareolar periareolar left breast, containing
low-level internal echoes. At real time, these were circumscribed
and had fewer internal echoes than seen on some of the initially
submitted images. No internal blood flow was demonstrated within any
of these with power Doppler.

In the 5:30 o'clock position of the right breast, 3 cm from the
nipple, a 9 x 8 x 4 mm cyst is demonstrated just beneath the skin.
This contains minimal thin internal septations with no internal
blood flow with power Doppler. This corresponds to the mammographic
and recently palpated mass.
IMPRESSION: 1. No evidence of malignancy.
2. Bilateral benign breast cysts.

RECOMMENDATION:
Bilateral screening mammogram in 1 year.

I have discussed the findings and recommendations with the patient.
If applicable, a reminder letter will be sent to the patient
regarding the next appointment.

BI-RADS CATEGORY  2: Benign.

## 2022-10-11 ENCOUNTER — Ambulatory Visit: Payer: Self-pay | Attending: Physician Assistant | Admitting: Physician Assistant

## 2022-10-11 ENCOUNTER — Encounter: Payer: Self-pay | Admitting: Physician Assistant

## 2022-10-11 VITALS — BP 164/96 | HR 84 | Ht 65.0 in | Wt 152.8 lb

## 2022-10-11 DIAGNOSIS — N63 Unspecified lump in unspecified breast: Secondary | ICD-10-CM

## 2022-10-11 DIAGNOSIS — Z1239 Encounter for other screening for malignant neoplasm of breast: Secondary | ICD-10-CM

## 2022-10-11 DIAGNOSIS — R030 Elevated blood-pressure reading, without diagnosis of hypertension: Secondary | ICD-10-CM

## 2022-10-11 NOTE — Progress Notes (Signed)
Patient ID: Joyce Garcia, female   DOB: 1976/04/14, 47 y.o.   MRN: [id number]   Joyce Garcia, is a 47 y.o. female  YBO:[id number]  [id number]:[id number]  DOB - Nov 28, 1975  Chief Complaint  Patient presents with   Breast Mass   Breast Pain       Subjective:   Joyce Garcia is a 47 y.o. female here today for breast check.  She is overdue for mammogram.  She noticed a lump in her R upper outer breast a few weeks ago and it seems new to her.  She has h/o fibrocystic changes.  No s/sx.    She has never been on cholesterol or BP meds.  Last labs showed elevated cholesterol.  She wants to work on diet.    No problems updated.  ALLERGIES: No Known Allergies  PAST MEDICAL HISTORY: Past Medical History:  Diagnosis Date   Abnormal Pap smear    at HD prior to becoming pregnant, unsure of what was abnormal   History of breast surgery    removed a lump    MEDICATIONS AT HOME: Prior to Admission medications   Medication Sig Start Date End Date Taking? Authorizing Provider  fluticasone (FLONASE) 50 MCG/ACT nasal spray PLACE 2 SPRAYS INTO BOTH NOSTRILS DAILY. 08/04/20 08/04/21  Argentina Donovan, PA-C  ibuprofen (ADVIL) 600 MG tablet Take 1 tablet (600 mg total) by mouth every 6 (six) hours as needed. Patient not taking: Reported on 08/04/2020 09/03/19   Seabron Spates, CNM    ROS: Neg HEENT Neg resp Neg cardiac Neg GI Neg GU Neg MS Neg psych Neg neuro  Objective:   Vitals:   10/11/22 0956 10/11/22 1022  BP: (!) 188/99 (!) 164/96  Pulse: 84   SpO2: 98%   Weight: 152 lb 12.8 oz (69.3 kg)   Height: '5\' 5"'$  (1.651 m)    Exam General appearance : Awake, alert, not in any distress. Speech Clear. Not toxic looking HEENT: Atraumatic and Normocephalic Neck: Supple, no JVD. No cervical lymphadenopathy.  Chest: Good air entry bilaterally, CTAB.  No rales/rhonchi/wheezing CVS: S1 S2 regular, no murmurs.  B breasts pendulous with no external changes.  She has  some scattered soft and mobile fibrocystic changes.  There is a <1cm prominence in the R outer upper breast that is freely mobile, non-fixed.  No discreet lesions on L.  B axilla without lump/LN Extremities: B/L Lower Ext shows no edema, both legs are warm to touch Neurology: Awake alert, and oriented X 3, CN II-XII intact, Non focal Skin: No Rash  Data Review Lab Results  Component Value Date   HGBA1C 5.4 05/30/2020    Assessment & Plan   1. Breast lump in upper outer quadrant Continue SBE - MM Digital Diagnostic Unilat R; Future - MM Digital Screening Unilat L; Future  2. Elevated blood pressure reading Check BP OOO and record 2 to 3 times weekly.  Bring to next visit  3. Screening for breast cancer using non-mammogram modality Mmg ordered    Return for assign PCP in 6 weeks and recheck blood pressure.  The patient was given clear instructions to go to ER or return to medical center if symptoms don't improve, worsen or new problems develop. The patient verbalized understanding. The patient was told to call to get lab results if they haven't heard anything in the next week.      Freeman Caldron, PA-C [facility] and [facility], [place]   10/11/2022, 10:22 AM

## 2022-10-11 NOTE — Patient Instructions (Addendum)
Check blood pressure 2 to 3 times weekly and record and bring to next visit  Goal blood pressure is <130/<85

## 2022-10-22 ENCOUNTER — Other Ambulatory Visit: Payer: Self-pay

## 2022-10-22 DIAGNOSIS — N6320 Unspecified lump in the left breast, unspecified quadrant: Secondary | ICD-10-CM

## 2022-10-22 DIAGNOSIS — N644 Mastodynia: Secondary | ICD-10-CM

## 2022-10-22 DIAGNOSIS — N6310 Unspecified lump in the right breast, unspecified quadrant: Secondary | ICD-10-CM

## 2022-11-01 ENCOUNTER — Ambulatory Visit: Payer: Self-pay | Admitting: Hematology and Oncology

## 2022-11-01 VITALS — BP 148/88 | Wt 145.9 lb

## 2022-11-01 DIAGNOSIS — N644 Mastodynia: Secondary | ICD-10-CM

## 2022-11-01 DIAGNOSIS — Z1211 Encounter for screening for malignant neoplasm of colon: Secondary | ICD-10-CM

## 2022-11-01 NOTE — Progress Notes (Signed)
Taught [name] about self breast awareness and gave educational materials to take home. Patient did not need a Pap smear today due to last Pap smear was in 2021 per patient.  Let her know [facility] will cover Pap smears every 5 years unless has a history of abnormal Pap smears. Referred patient to the [name] for diagnostic mammogram. Appointment scheduled for 11/01/22. Patient aware of appointment and will be there. Let patient know will follow up with her within the next couple weeks with results. Joyce Garcia verbalized understanding.  Melodye Ped, NP 11/01/22 1:40 PM

## 2022-11-13 ENCOUNTER — Ambulatory Visit
Admission: RE | Admit: 2022-11-13 | Discharge: 2022-11-13 | Disposition: A | Discharge status: Home / Self Care | Payer: Self-pay | Source: Ambulatory Visit | Attending: Obstetrics and Gynecology | Admitting: Obstetrics and Gynecology

## 2022-11-13 ENCOUNTER — Other Ambulatory Visit: Payer: Self-pay | Admitting: Obstetrics and Gynecology

## 2022-11-13 ENCOUNTER — Ambulatory Visit
Admission: RE | Admit: 2022-11-13 | Discharge: 2022-11-13 | Disposition: A | Discharge status: Home / Self Care | Payer: No Typology Code available for payment source | Source: Ambulatory Visit | Attending: Obstetrics and Gynecology | Admitting: Obstetrics and Gynecology

## 2022-11-13 DIAGNOSIS — N6310 Unspecified lump in the right breast, unspecified quadrant: Secondary | ICD-10-CM

## 2022-11-13 DIAGNOSIS — N644 Mastodynia: Secondary | ICD-10-CM

## 2022-11-13 DIAGNOSIS — N6320 Unspecified lump in the left breast, unspecified quadrant: Secondary | ICD-10-CM

## 2022-11-14 ENCOUNTER — Ambulatory Visit
Admission: RE | Admit: 2022-11-14 | Discharge: 2022-11-14 | Disposition: A | Discharge status: Home / Self Care | Payer: No Typology Code available for payment source | Source: Ambulatory Visit | Attending: Obstetrics and Gynecology | Admitting: Obstetrics and Gynecology

## 2022-11-14 DIAGNOSIS — N6310 Unspecified lump in the right breast, unspecified quadrant: Secondary | ICD-10-CM

## 2022-11-14 HISTORY — PX: BREAST BIOPSY: SHX20

## 2022-11-28 ENCOUNTER — Ambulatory Visit: Payer: Self-pay | Attending: Family Medicine | Admitting: Family Medicine

## 2022-11-28 VITALS — BP 161/91 | HR 80 | Ht 65.0 in | Wt 141.4 lb

## 2022-11-28 DIAGNOSIS — I10 Essential (primary) hypertension: Secondary | ICD-10-CM | POA: Insufficient documentation

## 2022-11-28 DIAGNOSIS — Z131 Encounter for screening for diabetes mellitus: Secondary | ICD-10-CM

## 2022-11-28 MED ORDER — AMLODIPINE BESYLATE 2.5 MG PO TABS
2.5000 mg | ORAL_TABLET | Freq: Every day | ORAL | 1 refills | Status: AC
  Filled 2023-02-28: qty 90, 90d supply, fill #0

## 2022-11-28 NOTE — Progress Notes (Signed)
Elevated BP

## 2022-11-28 NOTE — Progress Notes (Signed)
Subjective:  Patient ID: Joyce Garcia, female    DOB: December 12, 1975  Age: 47 y.o. MRN: [id number]  CC: Establish Care   HPI Joyce Garcia is a 47 y.o. year old female with a history of elevated blood pressure without the diagnosis of hypertension here to establish care.  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 Erroll Luna at [facility] surgery with an appointment on December 04, 2022.  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  She is currently not on antihypertensive. Denies presence of additional concerns today. Past Medical History:  Diagnosis Date   Abnormal Pap smear    at [facility] prior to becoming pregnant, unsure of what was abnormal   History of breast surgery    removed a lump    Past Surgical History:  Procedure Laterality Date   BREAST BIOPSY Right 11/14/2022   [place] RT BREAST BX W LOC DEV 1ST LESION IMG BX SPEC US GUIDE 11/14/2022 [facility]MOGR[facility]   BREAST EXCISIONAL BIOPSY Left 1996   BREAST SURGERY     had lump removed   DILATION AND CURETTAGE OF UTERUS     miscarriage    Family History  Problem Relation Age of Onset   Hypertension Father    Hypertension Sister    Hypertension Sister    Hypertension Sister     Social History   Socioeconomic History   Marital status: Single    Spouse name: Not on file   Number of children: Not on file   Years of education: Not on file   Highest education level: Not on file  Occupational History   Not on file  Tobacco Use   Smoking status: Never   Smokeless tobacco: Never  Vaping Use   Vaping Use: Never used  Substance and Sexual Activity   Alcohol use: No   Drug use: No   Sexual activity: Yes     Birth control/protection: None  Other Topics Concern   Not on file  Social History Narrative   With boyfriend x 10 years.  Has 4 children.  2 from this marriage and 2 from previous marriage. [place] came [place].    Social Determinants of Health   Financial Resource Strain: Not on file  Food Insecurity: No Food Insecurity (11/01/2022)   Hunger Vital Sign    Worried About Running Out of Food in the Last Year: Never true    Ran Out of Food in the Last Year: Never true  Transportation Needs: No Transportation Needs (11/01/2022)   PRAPARE - Hydrologist (Medical): No    Lack of Transportation (Non-Medical): No  Physical Activity: Not on file  Stress: Not on file  Social Connections: Not on file    No Known Allergies  Outpatient Medications Prior to Visit  Medication Sig Dispense Refill   fluticasone (FLONASE) 50 MCG/ACT nasal spray PLACE 2 SPRAYS INTO BOTH NOSTRILS DAILY. 16 g 6   ibuprofen (ADVIL) 600 MG tablet Take 1 tablet (600 mg total) by mouth every 6 (six) hours as needed. (Patient not taking: Reported on 08/04/2020) 30 tablet 1   No facility-administered medications prior to visit.     ROS Review of  Systems  Constitutional:  Negative for activity change and appetite change.  HENT:  Negative for sinus pressure and sore throat.   Respiratory:  Negative for chest tightness, shortness of breath and wheezing.   Cardiovascular:  Negative for chest pain and palpitations.  Gastrointestinal:  Negative for abdominal distention, abdominal pain and constipation.  Genitourinary: Negative.   Musculoskeletal: Negative.   Psychiatric/Behavioral:  Negative for behavioral problems and dysphoric mood.     Objective:  BP (!) 161/91   Pulse 80   Ht '5\' 5"'$  (1.651 m)   Wt 141 lb 6.4 oz (64.1 kg)   LMP 10/28/2022 (Exact Date)   SpO2 99%   BMI 23.53 kg/m      11/28/2022    9:46 AM 11/01/2022    1:18 PM 10/11/2022   10:22 AM  BP/Weight   Systolic BP [id number]  Diastolic BP 91 88 96  Wt. (Lbs) 141.4 145.9   BMI 23.53 kg/m2 24.28 kg/m2       Physical Exam Constitutional:      Appearance: She is well-developed.  Cardiovascular:     Rate and Rhythm: Normal rate.     Heart sounds: Normal heart sounds. No murmur heard. Pulmonary:     Effort: Pulmonary effort is normal.     Breath sounds: Normal breath sounds. No wheezing or rales.  Chest:     Chest wall: No tenderness.  Abdominal:     General: Bowel sounds are normal. There is no distension.     Palpations: Abdomen is soft. There is no mass.     Tenderness: There is no abdominal tenderness.  Musculoskeletal:        General: Normal range of motion.     Right lower leg: No edema.     Left lower leg: No edema.  Neurological:     Mental Status: She is alert and oriented to person, place, and time.  Psychiatric:        Mood and Affect: Mood normal.        Latest Ref Rng & Units 05/30/2020   10:55 AM 09/03/2019    2:52 AM 06/01/2013   12:16 PM  CMP  Glucose 65 - 99 mg/dL 84  103  84   BUN 6 - 20 mg/dL  15  12   Creatinine 0.44 - 1.00 mg/dL  0.74  0.78   Sodium 135 - 145 mmol/L  136  142   Potassium 3.5 - 5.1 mmol/L  3.9  3.8   Chloride 98 - 111 mmol/L  102  107   CO2 22 - 32 mmol/L  25  27   Calcium 8.9 - 10.3 mg/dL  9.3  9.3   Total Protein 6.5 - 8.1 g/dL  7.6  7.4   Total Bilirubin 0.3 - 1.2 mg/dL  0.3  0.7   Alkaline Phos 38 - 126 U/L  48  50   AST 15 - 41 U/L  19  15   ALT 0 - 44 U/L  18  21     Lipid Panel     Component Value Date/Time   CHOL 212 (H) 05/30/2020 1055   TRIG 72 05/30/2020 1055   HDL 53 05/30/2020 1055   CHOLHDL 4.0 05/30/2020 1055   CHOLHDL 4.5 06/01/2013 1216   VLDL 16 06/01/2013 1216   LDLCALC 146 (H) 05/30/2020 1055    CBC    Component Value Date/Time   WBC 9.1 09/03/2019 0252   RBC 3.92 09/03/2019 0252   HGB 12.6  09/03/2019 0252   HCT 38.8 09/03/2019 0252   PLT 332 09/03/2019 0252   MCV 99.0 09/03/2019 0252    MCH 32.1 09/03/2019 0252   MCHC 32.5 09/03/2019 0252   RDW 13.1 09/03/2019 0252   LYMPHSABS 2.3 06/01/2013 1216   MONOABS 0.4 06/01/2013 1216   EOSABS 0.1 06/01/2013 1216   BASOSABS 0.0 06/01/2013 1216    Lab Results  Component Value Date   HGBA1C 5.4 05/30/2020    Assessment & Plan:  1. Primary hypertension Uncontrolled She attributes this to recent stressor of caring for her sick sister who recently passed Review of her vitals indicates she has had elevated blood pressures over the last 3 months After shared decision making we have decided to initiate amlodipine at a low dose and we will titrate this accordingly Counseled on blood pressure goal of less than 130/80, low-sodium, DASH diet, medication compliance, 150 minutes of moderate intensity exercise per week. Discussed medication compliance, adverse effects.  - amLODipine (NORVASC) 2.5 MG tablet; Take 1 tablet (2.5 mg total) by mouth daily.  Dispense: 90 tablet; Refill: 1 - LP+Non-HDL Cholesterol - CMP14+EGFR - CBC with Differential/Platelet  2. Screening for diabetes mellitus - Hemoglobin A1c    Meds ordered this encounter  Medications   amLODipine (NORVASC) 2.5 MG tablet    Sig: Take 1 tablet (2.5 mg total) by mouth daily.    Dispense:  90 tablet    Refill:  1    Follow-up: Return in about 1 month (around 12/29/2022) for [place], MD, FAAFP. [facility] [place], [place]   11/28/2022, 10:07 AM

## 2022-11-28 NOTE — Patient Instructions (Signed)
Managing Your Hypertension Hypertension, also called high blood pressure, is when the force of the blood pressing against the walls of the arteries is too strong. Arteries are blood vessels that carry blood from your heart throughout your body. Hypertension forces the heart to work harder to pump blood and may cause the arteries to become narrow or stiff. Understanding blood pressure readings A blood pressure reading includes a higher number over a lower number: The first, or top, number is called the systolic pressure. It is a measure of the pressure in your arteries as your heart beats. The second, or bottom number, is called the diastolic pressure. It is a measure of the pressure in your arteries as the heart relaxes. For most people, a normal blood pressure is below 120/80. Your personal target blood pressure may vary depending on your medical conditions, your age, and other factors. Blood pressure is classified into four stages. Based on your blood pressure reading, your health care provider may use the following stages to determine what type of treatment you need, if any. Systolic pressure and diastolic pressure are measured in a unit called millimeters of mercury (mmHg). Normal Systolic pressure: below 120. Diastolic pressure: below 80. Elevated Systolic pressure: 120-129. Diastolic pressure: below 80. Hypertension stage 1 Systolic pressure: 130-139. Diastolic pressure: 80-89. Hypertension stage 2 Systolic pressure: 140 or above. Diastolic pressure: 90 or above. How can this condition affect me? Managing your hypertension is very important. Over time, hypertension can damage the arteries and decrease blood flow to parts of the body, including the brain, heart, and kidneys. Having untreated or uncontrolled hypertension can lead to: A heart attack. A stroke. A weakened blood vessel (aneurysm). Heart failure. Kidney damage. Eye damage. Memory and concentration problems. Vascular  dementia. What actions can I take to manage this condition? Hypertension can be managed by making lifestyle changes and possibly by taking medicines. Your health care provider will help you make a plan to bring your blood pressure within a normal range. You may be referred for counseling on a healthy diet and physical activity. Nutrition  Eat a diet that is high in fiber and potassium, and low in salt (sodium), added sugar, and fat. An example eating plan is called the DASH diet. DASH stands for Dietary Approaches to Stop Hypertension. To eat this way: Eat plenty of fresh fruits and vegetables. Try to fill one-half of your plate at each meal with fruits and vegetables. Eat whole grains, such as whole-wheat pasta, brown rice, or whole-grain bread. Fill about one-fourth of your plate with whole grains. Eat low-fat dairy products. Avoid fatty cuts of meat, processed or cured meats, and poultry with skin. Fill about one-fourth of your plate with lean proteins such as fish, chicken without skin, beans, eggs, and tofu. Avoid pre-made and processed foods. These tend to be higher in sodium, added sugar, and fat. Reduce your daily sodium intake. Many people with hypertension should eat less than 1,500 mg of sodium a day. Lifestyle  Work with your health care provider to maintain a healthy body weight or to lose weight. Ask what an ideal weight is for you. Get at least 30 minutes of exercise that causes your heart to beat faster (aerobic exercise) most days of the week. Activities may include walking, swimming, or biking. Include exercise to strengthen your muscles (resistance exercise), such as weight lifting, as part of your weekly exercise routine. Try to do these types of exercises for 30 minutes at least 3 days a week. Do   not use any products that contain nicotine or tobacco. These products include cigarettes, chewing tobacco, and vaping devices, such as e-cigarettes. If you need help quitting, ask your  health care provider. Control any long-term (chronic) conditions you have, such as high cholesterol or diabetes. Identify your sources of stress and find ways to manage stress. This may include meditation, deep breathing, or making time for fun activities. Alcohol use Do not drink alcohol if: Your health care provider tells you not to drink. You are pregnant, may be pregnant, or are planning to become pregnant. If you drink alcohol: Limit how much you have to: 0-1 drink a day for women. 0-2 drinks a day for men. Know how much alcohol is in your drink. In the [place] one drink equals one 12 oz bottle of beer (355 mL), one 5 oz glass of wine (148 mL), or one 1 oz glass of hard liquor (44 mL). Medicines Your health care provider may prescribe medicine if lifestyle changes are not enough to get your blood pressure under control and if: Your systolic blood pressure is 130 or higher. Your diastolic blood pressure is 80 or higher. Take medicines only as told by your health care provider. Follow the directions carefully. Blood pressure medicines must be taken as told by your health care provider. The medicine does not work as well when you skip doses. Skipping doses also puts you at risk for problems. Monitoring Before you monitor your blood pressure: Do not smoke, drink caffeinated beverages, or exercise within 30 minutes before taking a measurement. Use the bathroom and empty your bladder (urinate). Sit quietly for at least 5 minutes before taking measurements. Monitor your blood pressure at home as told by your health care provider. To do this: Sit with your back straight and supported. Place your feet flat on the floor. Do not cross your legs. Support your arm on a flat surface, such as a table. Make sure your upper arm is at heart level. Each time you measure, take two or three readings one minute apart and record the results. You may also need to have your blood pressure checked regularly by  your health care provider. General information Talk with your health care provider about your diet, exercise habits, and other lifestyle factors that may be contributing to hypertension. Review all the medicines you take with your health care provider because there may be side effects or interactions. Keep all follow-up visits. Your health care provider can help you create and adjust your plan for managing your high blood pressure. Where to find more information National Heart, Lung, and Blood Institute: www.nhlbi.nih.gov American Heart Association: www.heart.org Contact a health care provider if: You think you are having a reaction to medicines you have taken. You have repeated (recurrent) headaches. You feel dizzy. You have swelling in your ankles. You have trouble with your vision. Get help right away if: You develop a severe headache or confusion. You have unusual weakness or numbness, or you feel faint. You have severe pain in your chest or abdomen. You vomit repeatedly. You have trouble breathing. These symptoms may be an emergency. Get help right away. Call 911. Do not wait to see if the symptoms will go away. Do not drive yourself to the hospital. Summary Hypertension is when the force of blood pumping through your arteries is too strong. If this condition is not controlled, it may put you at risk for serious complications. Your personal target blood pressure may vary depending on your medical conditions,   your age, and other factors. For most people, a normal blood pressure is less than 120/80. Hypertension is managed by lifestyle changes, medicines, or both. Lifestyle changes to help manage hypertension include losing weight, eating a healthy, low-sodium diet, exercising more, stopping smoking, and limiting alcohol. This information is not intended to replace advice given to you by your health care provider. Make sure you discuss any questions you have with your health care  provider. Document Revised: 05/18/2021 Document Reviewed: 05/18/2021 Elsevier Patient Education  2023 Elsevier Inc.  

## 2022-11-29 ENCOUNTER — Other Ambulatory Visit: Payer: Self-pay | Admitting: Family Medicine

## 2022-11-29 LAB — CBC WITH DIFFERENTIAL/PLATELET
Basophils Absolute: 0 10*3/uL (ref 0.0–0.2)
Basos: 1 %
EOS (ABSOLUTE): 0.1 10*3/uL (ref 0.0–0.4)
Eos: 1 %
Hematocrit: 38.5 % (ref 34.0–46.6)
Hemoglobin: 12.7 g/dL (ref 11.1–15.9)
Immature Grans (Abs): 0 10*3/uL (ref 0.0–0.1)
Immature Granulocytes: 0 %
Lymphocytes Absolute: 1.8 10*3/uL (ref 0.7–3.1)
Lymphs: 35 %
MCH: 31.5 pg (ref 26.6–33.0)
MCHC: 33 g/dL (ref 31.5–35.7)
MCV: 96 fL (ref 79–97)
Monocytes Absolute: 0.5 10*3/uL (ref 0.1–0.9)
Monocytes: 11 %
Neutrophils Absolute: 2.6 10*3/uL (ref 1.4–7.0)
Neutrophils: 52 %
Platelets: 314 10*3/uL (ref 150–450)
RBC: 4.03 x10E6/uL (ref 3.77–5.28)
RDW: 13.4 % (ref 11.7–15.4)
WBC: 5.1 10*3/uL (ref 3.4–10.8)

## 2022-11-29 LAB — CMP14+EGFR
ALT: 13 IU/L (ref 0–32)
AST: 16 IU/L (ref 0–40)
Albumin/Globulin Ratio: 1.5 (ref 1.2–2.2)
Albumin: 4.6 g/dL (ref 3.9–4.9)
Alkaline Phosphatase: 59 IU/L (ref 44–121)
BUN/Creatinine Ratio: 13 (ref 9–23)
BUN: 13 mg/dL (ref 6–24)
Bilirubin Total: 0.6 mg/dL (ref 0.0–1.2)
CO2: 22 mmol/L (ref 20–29)
Calcium: 9.6 mg/dL (ref 8.7–10.2)
Chloride: 103 mmol/L (ref 96–106)
Creatinine, Ser: 1.01 mg/dL — ABNORMAL HIGH (ref 0.57–1.00)
Globulin, Total: 3 g/dL (ref 1.5–4.5)
Glucose: 97 mg/dL (ref 70–99)
Potassium: 3.7 mmol/L (ref 3.5–5.2)
Sodium: 143 mmol/L (ref 134–144)
Total Protein: 7.6 g/dL (ref 6.0–8.5)
eGFR: 69 mL/min/{1.73_m2} (ref >59)

## 2022-11-29 LAB — HEMOGLOBIN A1C
Est. average glucose Bld gHb Est-mCnc: 114 mg/dL
Hgb A1c MFr Bld: 5.6 % (ref 4.8–5.6)

## 2022-11-29 LAB — LP+NON-HDL CHOLESTEROL
Cholesterol, Total: 225 mg/dL — ABNORMAL HIGH (ref 100–199)
HDL: 59 mg/dL (ref >39)
LDL Chol Calc (NIH): 157 mg/dL — ABNORMAL HIGH (ref 0–99)
Total Non-HDL-Chol (LDL+VLDL): 166 mg/dL — ABNORMAL HIGH (ref 0–129)
Triglycerides: 53 mg/dL (ref 0–149)
VLDL Cholesterol Cal: 9 mg/dL (ref 5–40)

## 2022-11-29 MED ORDER — ATORVASTATIN CALCIUM 20 MG PO TABS: 20.0000 mg | ORAL_TABLET | Freq: Every day | ORAL | 1 refills | Status: AC

## 2023-01-09 ENCOUNTER — Ambulatory Visit: Payer: Self-pay | Admitting: Family Medicine

## 2023-01-21 ENCOUNTER — Other Ambulatory Visit: Payer: Self-pay | Admitting: General Surgery

## 2023-01-21 DIAGNOSIS — N6311 Unspecified lump in the right breast, upper outer quadrant: Secondary | ICD-10-CM

## 2023-01-24 ENCOUNTER — Other Ambulatory Visit: Payer: Self-pay | Admitting: General Surgery

## 2023-01-24 DIAGNOSIS — N6311 Unspecified lump in the right breast, upper outer quadrant: Secondary | ICD-10-CM

## 2023-02-22 ENCOUNTER — Other Ambulatory Visit: Payer: Self-pay

## 2023-02-22 ENCOUNTER — Encounter (HOSPITAL_BASED_OUTPATIENT_CLINIC_OR_DEPARTMENT_OTHER): Payer: Self-pay | Admitting: General Surgery

## 2023-02-28 ENCOUNTER — Other Ambulatory Visit: Payer: Self-pay

## 2023-02-28 ENCOUNTER — Other Ambulatory Visit: Payer: Self-pay | Admitting: Family Medicine

## 2023-02-28 DIAGNOSIS — I10 Essential (primary) hypertension: Secondary | ICD-10-CM

## 2023-02-28 NOTE — Telephone Encounter (Signed)
Medication Refill - Medication: Rx #: [id number]  amLODipine (NORVASC) 2.5 MG tablet [[id number]]   atorvastatin (LIPITOR) 20 MG tablet [[id number]]   Has the patient contacted their pharmacy? Yes.   (Agent: If no, request that the patient contact the pharmacy for the refill. If patient does not wish to contact the pharmacy document the reason why and proceed with request.) (Agent: If yes, when and what did the pharmacy advise?)  Preferred Pharmacy (with phone number or street name [facility] [[id number]]   Has the patient been seen for an appointment in the last year OR does the patient have an upcoming appointment? Yes.    Agent: Please be advised that RX refills may take up to 3 business days. We ask that you follow-up with your pharmacy.

## 2023-03-01 ENCOUNTER — Other Ambulatory Visit: Payer: Self-pay

## 2023-03-01 NOTE — Telephone Encounter (Signed)
Unable to refill per protocol, Patient reported not taking 02/22/23. Request is too soon, last refill 11/28/22 for 90 and 1 refill.  Requested Prescriptions  Pending Prescriptions Disp Refills   amLODipine (NORVASC) 2.5 MG tablet 90 tablet 1    Sig: Take 1 tablet (2.5 mg total) by mouth daily.     Cardiovascular: Calcium Channel Blockers 2 Failed - 02/28/2023  5:05 PM      Failed - Last BP in normal range    BP Readings from Last 1 Encounters:  11/28/22 (!) 161/91         Passed - Last Heart Rate in normal range    Pulse Readings from Last 1 Encounters:  11/28/22 80         Passed - Valid encounter within last 6 months    Recent Outpatient Visits           3 months ago Primary hypertension   [place] [facility] [place], Odette Horn[place], MD   4 months ago Breast lump in upper outer quadrant   [place] [place], [place], [place]   2 years ago Non-recurrent acute serous otitis media of right ear   [place] [place], [place], [place]   2 years ago Hyperlipidemia, unspecified hyperlipidemia type   [facility] [place], Virginia J, NP   4 years ago Screening for cervical cancer   [facility] [place], [place], MD               atorvastatin (LIPITOR) 20 MG tablet 90 tablet 1    Sig: Take 1 tablet (20 mg total) by mouth daily.     Cardiovascular:  Antilipid - Statins Failed - 02/28/2023  5:05 PM      Failed - Lipid Panel in normal range within the last 12 months    Cholesterol, Total  Date Value Ref Range Status  11/28/2022 225 (H) 100 - 199 mg/dL Final   LDL Chol Calc (NIH)  Date Value Ref Range Status  11/28/2022 157 (H) 0 - 99 mg/dL Final   HDL  Date Value Ref Range Status  11/28/2022 59 >39 mg/dL Final   Triglycerides  Date Value Ref Range Status  11/28/2022 53 0 - 149 mg/dL Final         Passed - Patient is not  pregnant      Passed - Valid encounter within last 12 months    Recent Outpatient Visits           3 months ago Primary hypertension   [place] [facility] [place], [place], [place]   4 months ago Breast lump in upper outer quadrant   [facility] [place], [place], [place]   2 years ago Non-recurrent acute serous otitis media of right ear   [facility] [place], [place], [place]   2 years ago Hyperlipidemia, unspecified hyperlipidemia type   [facility] [place], Virginia J, NP   4 years ago Screening for cervical cancer   [place] [facility] [place], MD

## 2023-03-01 NOTE — Telephone Encounter (Signed)
Unable to refill per protocol, Patient not taking:  Reported on 02/22/2023

## 2023-03-04 ENCOUNTER — Encounter (HOSPITAL_BASED_OUTPATIENT_CLINIC_OR_DEPARTMENT_OTHER)
Admission: RE | Admit: 2023-03-04 | Discharge: 2023-03-04 | Disposition: A | Discharge status: Home / Self Care | Payer: No Typology Code available for payment source | Source: Ambulatory Visit | Attending: General Surgery | Admitting: General Surgery

## 2023-03-04 ENCOUNTER — Ambulatory Visit
Admission: RE | Admit: 2023-03-04 | Discharge: 2023-03-04 | Disposition: A | Discharge status: Home / Self Care | Payer: Self-pay | Source: Ambulatory Visit | Attending: General Surgery | Admitting: General Surgery

## 2023-03-04 ENCOUNTER — Other Ambulatory Visit: Payer: Self-pay | Admitting: General Surgery

## 2023-03-04 ENCOUNTER — Ambulatory Visit
Admission: RE | Admit: 2023-03-04 | Discharge: 2023-03-04 | Disposition: A | Discharge status: Home / Self Care | Payer: No Typology Code available for payment source | Source: Ambulatory Visit | Attending: General Surgery | Admitting: General Surgery

## 2023-03-04 DIAGNOSIS — Z01818 Encounter for other preprocedural examination: Secondary | ICD-10-CM | POA: Insufficient documentation

## 2023-03-04 DIAGNOSIS — N6311 Unspecified lump in the right breast, upper outer quadrant: Secondary | ICD-10-CM

## 2023-03-04 HISTORY — PX: BREAST BIOPSY: SHX20

## 2023-03-04 LAB — POCT PREGNANCY, URINE: Preg Test, Ur: NEGATIVE

## 2023-03-04 MED ORDER — CHLORHEXIDINE GLUCONATE CLOTH 2 % EX PADS: 6.0000 | MEDICATED_PAD | Freq: Once | CUTANEOUS | Status: DC

## 2023-03-04 NOTE — Anesthesia Preprocedure Evaluation (Signed)
Anesthesia Evaluation  Patient identified by MRN, date of birth, ID band Patient awake    Reviewed: Allergy & Precautions, H&P , NPO status , Patient's Chart, lab work & pertinent test results  Airway Mallampati: II  TM Distance: >3 FB Neck ROM: Full    Dental no notable dental hx. (+) Teeth Intact, Dental Advisory Given   Pulmonary neg pulmonary ROS   Pulmonary exam normal breath sounds clear to auscultation       Cardiovascular Exercise Tolerance: Good hypertension, Pt. on medications negative cardio ROS Normal cardiovascular exam Rhythm:Regular Rate:Normal     Neuro/Psych negative neurological ROS  negative psych ROS   GI/Hepatic negative GI ROS, Neg liver ROS,GERD  ,,  Endo/Other  negative endocrine ROS    Renal/GU negative Renal ROS  negative genitourinary   Musculoskeletal negative musculoskeletal ROS (+) Arthritis ,    Abdominal   Peds negative pediatric ROS (+)  Hematology negative hematology ROS (+)   Anesthesia Other Findings   Reproductive/Obstetrics negative OB ROS                              Anesthesia Physical Anesthesia Plan  ASA: 2  Anesthesia Plan: General   Post-op Pain Management: Minimal or no pain anticipated   Induction: Intravenous  PONV Risk Score and Plan: 3 and Ondansetron, Dexamethasone and Treatment may vary due to age or medical condition  Airway Management Planned: LMA and Oral ETT  Additional Equipment: None  Intra-op Plan:   Post-operative Plan: Extubation in OR  Informed Consent: I have reviewed the patients History and Physical, chart, labs and discussed the procedure including the risks, benefits and alternatives for the proposed anesthesia with the patient or authorized representative who has indicated his/her understanding and acceptance.       Plan Discussed with: CRNA and Anesthesiologist  Anesthesia Plan Comments: ( )         Anesthesia Quick Evaluation

## 2023-03-04 NOTE — Progress Notes (Signed)

## 2023-03-04 NOTE — H&P (Signed)
REFERRING PHYSICIAN:  Amao   PROVIDER:  Matthias Hughs, MD   Care Team: Patient Care Team: Jaclyn Shaggy, MD as PCP - General (Family Medicine) Matthias Hughs, MD as Consulting Provider (Surgical Oncology)    MRN: [id number] DOB: 1975/12/17    Subjective    Chief Complaint: New Consultation (RIGHT breast SCLEROSED PAPILLARY LESION)       History of Present Illness: Joyce Garcia is a 47 y.o. female who is seen today as an office consultation at the request of Dr. Seymour Bars for evaluation of New Consultation (RIGHT breast SCLEROSED PAPILLARY LESION) .   Patient presents May 2024 with a right breast mass for approximately 2 months.  She subsequently underwent diagnostic breast imaging.  This demonstrated a 1.2 cm mass in the right breast 930 with 8 cm from the nipple.  There is also a cluster of cysts in the right breast between 6 and 9:00.  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  She was recommended for excision and was referred to surgery.   Of note her sister had a history of uterine cancer and kidney cancer.  They are from [place] in [place] but they have been here for a while and her sister was treated in the [place].  Her sister has passed away.     Work: housewife.     Diagnostic mammogram/us: [facility] 11/13/22 ACR Breast Density Category c: The breasts are heterogeneously dense, which may obscure small masses.   FINDINGS: Bilateral diagnostic mammogram: There are no new dominant masses, suspicious calcifications or secondary signs of malignancy identified in either breast.   RIGHT breast: Targeted ultrasound is performed, showing an irregular hypoechoic mass in the RIGHT breast at the 9:30 o'clock axis, 8 cm from the nipple, measuring 1.2 x 0.7 x 1.1 cm, without internal vascularity, corresponding to the palpable area of concern.   RIGHT axilla was evaluated with ultrasound showing no enlarged  or morphologically abnormal lymph nodes.   There is a benign cluster of cysts in the RIGHT breast at the 6 o'clock axis which measures 1.6 cm and a benign simple cyst in the RIGHT breast at the 9 o'clock axis which measures 1.2 cm, corresponding as incidental findings.   LEFT breast: Targeted ultrasound is performed, showing near complete resolution of a previously demonstrated cyst in the LEFT breast at the 2 o'clock axis. There is an additional benign cyst in the LEFT breast at the 12 o'clock axis.   IMPRESSION: 1. Indeterminate hypoechoic mass in the RIGHT breast at the 9:30 o'clock axis, 8 cm from the nipple, measuring 1.2 cm, corresponding to patient's palpable area of concern. Differential considerations include benign complicated cyst and benign fibroadenoma. Ultrasound-guided biopsy is recommended to exclude malignancy. 2. No evidence of malignancy within the LEFT breast. 3. Bilateral breast cysts, as detailed above.   RECOMMENDATION: Ultrasound-guided biopsy for the RIGHT breast mass at the 9:30 o'clock axis.   Ultrasound-guided biopsy is scheduled on February 28th.   I have discussed the findings and recommendations with the patient. If applicable, a reminder letter will be sent to the patient regarding the next appointment.   BI-RADS CATEGORY  4: Suspicious.     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 SEE NOTE - NEGATIVE FOR CARCINOMA       Review of Systems: A complete review of systems was obtained  from the patient.  I have reviewed this information and discussed as appropriate with the patient.  See HPI as well for other ROS.       Medical History: Past Medical History      Past Medical History:  Diagnosis Date   Arthritis     GERD (gastroesophageal reflux disease)     Hyperlipidemia     Hypertension           Problem List     Patient Active Problem List  Diagnosis   Mass of upper outer quadrant of right breast        Past Surgical History       Past Surgical History:  Procedure Laterality Date   MASTECTOMY PARTIAL / LUMPECTOMY            Allergies  No Known Allergies     Medications Ordered Prior to Encounter  No current outpatient medications on file prior to visit.    No current facility-administered medications on file prior to visit.        Family History  History reviewed. No pertinent family history.      Tobacco Use History  Social History       Tobacco Use  Smoking Status Never  Smokeless Tobacco Never        Social History  Social History        Socioeconomic History   Marital status: Single  Tobacco Use   Smoking status: Never   Smokeless tobacco: Never  Vaping Use   Vaping status: Never Used  Substance and Sexual Activity   Alcohol use: Never   Drug use: Never    Social Determinants of Health        Food Insecurity: No Food Insecurity (11/01/2022)    Received from [facility], [place]    Hunger Vital Sign     Worried About Running Out of Food in the Last Year: Never true     Ran Out of Food in the Last Year: Never true  Transportation Needs: No Transportation Needs (11/01/2022)    Received from [facility], [place]    PRAPARE - Transportation     Lack of Transportation (Medical): No     Lack of Transportation (Non-Medical): No        Objective:         Vitals:      Pulse: 86  Temp: 36.8 C (98.3 F)  SpO2: 98%  Weight: 63.2 kg (139 lb 6.4 oz)  Height: 165.1 cm (5\' 5" )  PainSc: 0-No pain    Body mass index is 23.2 kg/m.   Gen:  No acute distress.  Well nourished and well groomed.   Neurological: Alert and oriented to person, place, and time. Coordination normal.  Head: Normocephalic and atraumatic.  Eyes: Conjunctivae are normal. Pupils are equal, round, and reactive to light. No scleral icterus.  Neck: Normal  range of motion. Neck supple. No tracheal deviation or thyromegaly present.  Cardiovascular: Normal rate, regular rhythm, normal heart sounds and intact distal pulses.  Exam reveals no gallop and no friction rub.  No murmur heard. Breast: very dense breasts with multiple palpable abnormalities.  The 9:30 mass in question is vaguely palpable but is not well circumscribed.  There is some ptosis bilaterally.  Breasts reasonably symmetric. Left breast benign.  No LAD.   Respiratory: Effort normal.  No respiratory distress. No chest wall tenderness. Breath sounds normal.  No wheezes, rales or rhonchi.  GI: Soft. Bowel sounds are  normal. The abdomen is soft and nontender.  There is no rebound and no guarding.  Musculoskeletal: Normal range of motion. Extremities are nontender.  Lymphadenopathy: No cervical, preauricular, postauricular or axillary adenopathy is present Skin: Skin is warm and dry. No rash noted. No diaphoresis. No erythema. No pallor. No clubbing, cyanosis, or edema.   Psychiatric: Normal mood and affect. Behavior is normal. Judgment and thought content normal.      Labs CMET and CBC essentially normal March 2024.   Assessment and Plan:        ICD-10-CM    \             Mass of upper outer quadrant of right breast  N63.11         Given size of lesion and papillary pathology, I concur with the recommendations for excision.     The surgical procedure was described to the patient.  I discussed the incision type and location and that we will need radiology involved on with a seed marker.   We discussed the risks bleeding, infection, damage to other structures, need for further procedures/surgeries.  We discussed the risk of seroma.  The patient was advised if the breast has cancer, we may need to go back to surgery for additional tissue to obtain negative margins or for a lymph node biopsy. The patient was advised that these are the most common complications, but that others can occur  as well. I discussed the risk of alteration in breast contour or size.  I discussed risk of chronic pain.  There are rare instances of heart/lung issues post op as well as blood clots.      They were advised against taking aspirin or other anti-inflammatory agents/blood thinners the week before surgery.     The risks and benefits of the procedure were described to the patient and she wishes to proceed.

## 2023-03-05 ENCOUNTER — Ambulatory Visit (HOSPITAL_BASED_OUTPATIENT_CLINIC_OR_DEPARTMENT_OTHER): Payer: No Typology Code available for payment source | Admitting: Anesthesiology

## 2023-03-05 ENCOUNTER — Other Ambulatory Visit: Payer: Self-pay

## 2023-03-05 ENCOUNTER — Encounter (HOSPITAL_BASED_OUTPATIENT_CLINIC_OR_DEPARTMENT_OTHER): Payer: Self-pay | Admitting: General Surgery

## 2023-03-05 ENCOUNTER — Ambulatory Visit
Admission: RE | Admit: 2023-03-05 | Discharge: 2023-03-05 | Disposition: A | Discharge status: Home / Self Care | Payer: Self-pay | Source: Ambulatory Visit | Attending: General Surgery | Admitting: General Surgery

## 2023-03-05 ENCOUNTER — Ambulatory Visit (HOSPITAL_BASED_OUTPATIENT_CLINIC_OR_DEPARTMENT_OTHER)
Admission: RE | Admit: 2023-03-05 | Discharge: 2023-03-05 | Disposition: A | Discharge status: Home / Self Care | Payer: No Typology Code available for payment source | Attending: General Surgery | Admitting: General Surgery

## 2023-03-05 ENCOUNTER — Encounter (HOSPITAL_BASED_OUTPATIENT_CLINIC_OR_DEPARTMENT_OTHER)
Admission: RE | Disposition: A | Discharge status: Home / Self Care | Payer: Self-pay | Source: Home / Self Care | Attending: General Surgery

## 2023-03-05 DIAGNOSIS — Z8049 Family history of malignant neoplasm of other genital organs: Secondary | ICD-10-CM | POA: Insufficient documentation

## 2023-03-05 DIAGNOSIS — R928 Other abnormal and inconclusive findings on diagnostic imaging of breast: Secondary | ICD-10-CM

## 2023-03-05 DIAGNOSIS — N6011 Diffuse cystic mastopathy of right breast: Secondary | ICD-10-CM | POA: Insufficient documentation

## 2023-03-05 DIAGNOSIS — N6081 Other benign mammary dysplasias of right breast: Secondary | ICD-10-CM | POA: Insufficient documentation

## 2023-03-05 DIAGNOSIS — I10 Essential (primary) hypertension: Secondary | ICD-10-CM | POA: Insufficient documentation

## 2023-03-05 DIAGNOSIS — N6021 Fibroadenosis of right breast: Secondary | ICD-10-CM | POA: Insufficient documentation

## 2023-03-05 DIAGNOSIS — N6311 Unspecified lump in the right breast, upper outer quadrant: Secondary | ICD-10-CM

## 2023-03-05 DIAGNOSIS — Z01818 Encounter for other preprocedural examination: Secondary | ICD-10-CM

## 2023-03-05 DIAGNOSIS — Z8051 Family history of malignant neoplasm of kidney: Secondary | ICD-10-CM | POA: Insufficient documentation

## 2023-03-05 HISTORY — PX: RADIOACTIVE SEED GUIDED EXCISIONAL BREAST BIOPSY: SHX6490

## 2023-03-05 HISTORY — DX: Essential (primary) hypertension: I10

## 2023-03-05 HISTORY — DX: Unspecified osteoarthritis, unspecified site: M19.90

## 2023-03-05 HISTORY — DX: Gastro-esophageal reflux disease without esophagitis: K21.9

## 2023-03-05 LAB — POCT PREGNANCY, URINE: Preg Test, Ur: NEGATIVE

## 2023-03-05 SURGERY — RADIOACTIVE SEED GUIDED BREAST BIOPSY
Anesthesia: General | Site: Breast | Laterality: Right

## 2023-03-05 MED ORDER — DEXAMETHASONE SODIUM PHOSPHATE 10 MG/ML IJ SOLN
INTRAMUSCULAR | Status: AC
  Filled 2023-03-05: qty 1

## 2023-03-05 MED ORDER — BUPIVACAINE-EPINEPHRINE (PF) 0.25% -1:200000 IJ SOLN
INTRAMUSCULAR | Status: AC
  Filled 2023-03-05: qty 30

## 2023-03-05 MED ORDER — HEPARIN (PORCINE) IN NACL 1000-0.9 UT/500ML-% IV SOLN
INTRAVENOUS | Status: AC
  Filled 2023-03-05: qty 500

## 2023-03-05 MED ORDER — ACETAMINOPHEN 160 MG/5ML PO SOLN: 325.0000 mg | ORAL | Status: DC | PRN

## 2023-03-05 MED ORDER — CELECOXIB 200 MG PO CAPS
200.0000 mg | ORAL_CAPSULE | Freq: Once | ORAL | Status: AC
  Administered 2023-03-05: 200 mg via ORAL

## 2023-03-05 MED ORDER — PHENYLEPHRINE 80 MCG/ML (10ML) SYRINGE FOR IV PUSH (FOR BLOOD PRESSURE SUPPORT)
PREFILLED_SYRINGE | INTRAVENOUS | Status: AC
  Filled 2023-03-05: qty 10

## 2023-03-05 MED ORDER — FENTANYL CITRATE (PF) 100 MCG/2ML IJ SOLN: 25.0000 ug | INTRAMUSCULAR | Status: DC | PRN

## 2023-03-05 MED ORDER — MIDAZOLAM HCL 2 MG/2ML IJ SOLN
INTRAMUSCULAR | Status: AC
  Filled 2023-03-05: qty 2

## 2023-03-05 MED ORDER — CEFAZOLIN SODIUM-DEXTROSE 2-4 GM/100ML-% IV SOLN
2.0000 g | INTRAVENOUS | Status: AC
  Administered 2023-03-05: 2 g via INTRAVENOUS

## 2023-03-05 MED ORDER — ACETAMINOPHEN 500 MG PO TABS
ORAL_TABLET | ORAL | Status: AC
  Filled 2023-03-05: qty 2

## 2023-03-05 MED ORDER — FENTANYL CITRATE (PF) 100 MCG/2ML IJ SOLN
INTRAMUSCULAR | Status: AC
  Filled 2023-03-05: qty 2

## 2023-03-05 MED ORDER — CEFAZOLIN SODIUM-DEXTROSE 2-4 GM/100ML-% IV SOLN
INTRAVENOUS | Status: AC
  Filled 2023-03-05: qty 100

## 2023-03-05 MED ORDER — MEPERIDINE HCL 25 MG/ML IJ SOLN: 6.2500 mg | INTRAMUSCULAR | Status: DC | PRN

## 2023-03-05 MED ORDER — CELECOXIB 200 MG PO CAPS
ORAL_CAPSULE | ORAL | Status: AC
  Filled 2023-03-05: qty 1

## 2023-03-05 MED ORDER — SUCCINYLCHOLINE CHLORIDE 200 MG/10ML IV SOSY
PREFILLED_SYRINGE | INTRAVENOUS | Status: AC
  Filled 2023-03-05: qty 10

## 2023-03-05 MED ORDER — ATROPINE SULFATE 0.4 MG/ML IV SOLN
INTRAVENOUS | Status: AC
  Filled 2023-03-05: qty 1

## 2023-03-05 MED ORDER — LIDOCAINE HCL 1 % IJ SOLN
INTRAMUSCULAR | Status: DC | PRN
  Administered 2023-03-05: 40 mL via INTRAMUSCULAR

## 2023-03-05 MED ORDER — LIDOCAINE HCL (CARDIAC) PF 100 MG/5ML IV SOSY
PREFILLED_SYRINGE | INTRAVENOUS | Status: DC | PRN
  Administered 2023-03-05: 60 mg via INTRAVENOUS

## 2023-03-05 MED ORDER — EPHEDRINE 5 MG/ML INJ
INTRAVENOUS | Status: AC
  Filled 2023-03-05: qty 5

## 2023-03-05 MED ORDER — LACTATED RINGERS IV SOLN: INTRAVENOUS | Status: DC

## 2023-03-05 MED ORDER — HEPARIN SOD (PORK) LOCK FLUSH 100 UNIT/ML IV SOLN
INTRAVENOUS | Status: AC
  Filled 2023-03-05: qty 5

## 2023-03-05 MED ORDER — ACETAMINOPHEN 500 MG PO TABS
1000.0000 mg | ORAL_TABLET | Freq: Once | ORAL | Status: AC
  Administered 2023-03-05: 1000 mg via ORAL

## 2023-03-05 MED ORDER — PROPOFOL 10 MG/ML IV BOLUS
INTRAVENOUS | Status: DC | PRN
  Administered 2023-03-05: 200 mg via INTRAVENOUS

## 2023-03-05 MED ORDER — OXYCODONE HCL 5 MG PO TABS
5.0000 mg | ORAL_TABLET | Freq: Four times a day (QID) | ORAL | 0 refills | Status: AC | PRN
Start: 2023-03-05 — End: ?
  Filled 2023-03-05: qty 8, 2d supply, fill #0

## 2023-03-05 MED ORDER — ACETAMINOPHEN 325 MG PO TABS: 325.0000 mg | ORAL_TABLET | ORAL | Status: DC | PRN

## 2023-03-05 MED ORDER — LIDOCAINE 2% (20 MG/ML) 5 ML SYRINGE
INTRAMUSCULAR | Status: AC
  Filled 2023-03-05: qty 5

## 2023-03-05 MED ORDER — ONDANSETRON HCL 4 MG/2ML IJ SOLN
4.0000 mg | Freq: Once | INTRAMUSCULAR | Status: AC | PRN
  Administered 2023-03-05: 4 mg via INTRAVENOUS

## 2023-03-05 MED ORDER — MIDAZOLAM HCL 5 MG/5ML IJ SOLN
INTRAMUSCULAR | Status: DC | PRN
  Administered 2023-03-05: 2 mg via INTRAVENOUS

## 2023-03-05 MED ORDER — ONDANSETRON HCL 4 MG/2ML IJ SOLN
INTRAMUSCULAR | Status: AC
  Filled 2023-03-05: qty 2

## 2023-03-05 MED ORDER — ACETAMINOPHEN 500 MG PO TABS: 1000.0000 mg | ORAL_TABLET | ORAL | Status: DC

## 2023-03-05 MED ORDER — FENTANYL CITRATE (PF) 100 MCG/2ML IJ SOLN
INTRAMUSCULAR | Status: DC | PRN
  Administered 2023-03-05: 100 ug via INTRAVENOUS

## 2023-03-05 MED ORDER — LIDOCAINE HCL (PF) 1 % IJ SOLN
INTRAMUSCULAR | Status: AC
  Filled 2023-03-05: qty 30

## 2023-03-05 MED ORDER — DEXAMETHASONE SODIUM PHOSPHATE 4 MG/ML IJ SOLN
INTRAMUSCULAR | Status: DC | PRN
  Administered 2023-03-05: 5 mg via INTRAVENOUS

## 2023-03-05 MED ORDER — OXYCODONE HCL 5 MG PO TABS: 5.0000 mg | ORAL_TABLET | Freq: Once | ORAL | Status: DC | PRN

## 2023-03-05 MED ORDER — OXYCODONE HCL 5 MG/5ML PO SOLN: 5.0000 mg | Freq: Once | ORAL | Status: DC | PRN

## 2023-03-05 SURGICAL SUPPLY — 50 items
ADH SKN CLS APL DERMABOND .7 (GAUZE/BANDAGES/DRESSINGS) ×1
APL PRP STRL LF DISP 70% ISPRP (MISCELLANEOUS) ×1
BINDER BREAST LRG (GAUZE/BANDAGES/DRESSINGS) IMPLANT
BLADE SURG 10 STRL SS (BLADE) ×1 IMPLANT
BLADE SURG 15 STRL LF DISP TIS (BLADE) IMPLANT
BLADE SURG 15 STRL SS (BLADE)
CANISTER SUC SOCK COL 7IN (MISCELLANEOUS) IMPLANT
CANISTER SUCT 1200ML W/VALVE (MISCELLANEOUS) ×1 IMPLANT
CHLORAPREP W/TINT 26 (MISCELLANEOUS) ×1 IMPLANT
CLIP TI LARGE 6 (CLIP) ×1 IMPLANT
COVER BACK TABLE 60X90IN (DRAPES) ×1 IMPLANT
COVER MAYO STAND STRL (DRAPES) ×1 IMPLANT
COVER PROBE CYLINDRICAL 5X96 (MISCELLANEOUS) ×1 IMPLANT
DERMABOND ADVANCED .7 DNX12 (GAUZE/BANDAGES/DRESSINGS) ×1 IMPLANT
DRAPE LAPAROSCOPIC ABDOMINAL (DRAPES) ×1 IMPLANT
DRAPE UTILITY XL STRL (DRAPES) ×1 IMPLANT
ELECT COATED BLADE 2.86 ST (ELECTRODE) ×1 IMPLANT
ELECT REM PT RETURN 9FT ADLT (ELECTROSURGICAL) ×1
ELECTRODE REM PT RTRN 9FT ADLT (ELECTROSURGICAL) ×1 IMPLANT
GAUZE SPONGE 4X4 12PLY STRL LF (GAUZE/BANDAGES/DRESSINGS) ×1 IMPLANT
GLOVE BIO SURGEON STRL SZ 6 (GLOVE) ×1 IMPLANT
GLOVE BIOGEL PI IND STRL 6.5 (GLOVE) ×1 IMPLANT
GOWN STRL REUS W/ TWL LRG LVL3 (GOWN DISPOSABLE) ×1 IMPLANT
GOWN STRL REUS W/ TWL XL LVL3 (GOWN DISPOSABLE) ×1 IMPLANT
GOWN STRL REUS W/TWL LRG LVL3 (GOWN DISPOSABLE) ×1
GOWN STRL REUS W/TWL XL LVL3 (GOWN DISPOSABLE) ×1
KIT MARKER MARGIN INK (KITS) ×1 IMPLANT
LIGHT WAVEGUIDE WIDE FLAT (MISCELLANEOUS) IMPLANT
NDL HYPO 25X1 1.5 SAFETY (NEEDLE) ×1 IMPLANT
NEEDLE HYPO 25X1 1.5 SAFETY (NEEDLE) ×1 IMPLANT
NS IRRIG 1000ML POUR BTL (IV SOLUTION) ×1 IMPLANT
PACK BASIN DAY SURGERY FS (CUSTOM PROCEDURE TRAY) ×1 IMPLANT
PENCIL SMOKE EVACUATOR (MISCELLANEOUS) ×1 IMPLANT
SLEEVE SCD COMPRESS KNEE MED (STOCKING) ×1 IMPLANT
SPIKE FLUID TRANSFER (MISCELLANEOUS) IMPLANT
SPONGE T-LAP 18X18 ~~LOC~~+RFID (SPONGE) ×1 IMPLANT
STAPLER VISISTAT 35W (STAPLE) IMPLANT
STRIP CLOSURE SKIN 1/2X4 (GAUZE/BANDAGES/DRESSINGS) ×1 IMPLANT
SUT MON AB 4-0 PC3 18 (SUTURE) ×1 IMPLANT
SUT SILK 2 0 SH (SUTURE) IMPLANT
SUT VIC AB 2-0 SH 18 (SUTURE) IMPLANT
SUT VIC AB 3-0 SH 27 (SUTURE) ×2
SUT VIC AB 3-0 SH 27X BRD (SUTURE) ×1 IMPLANT
SUT VICRYL 3-0 CR8 SH (SUTURE) IMPLANT
SYR BULB EAR ULCER 3OZ GRN STR (SYRINGE) ×1 IMPLANT
SYR CONTROL 10ML LL (SYRINGE) ×1 IMPLANT
TOWEL GREEN STERILE FF (TOWEL DISPOSABLE) ×1 IMPLANT
TRAY FAXITRON CT DISP (TRAY / TRAY PROCEDURE) ×1 IMPLANT
TUBE CONNECTING 20X1/4 (TUBING) ×1 IMPLANT
YANKAUER SUCT BULB TIP NO VENT (SUCTIONS) ×1 IMPLANT

## 2023-03-05 NOTE — Discharge Instructions (Addendum)
[facility] Office Phone Number [phone]  BREAST BIOPSY/ PARTIAL MASTECTOMY: POST OP INSTRUCTIONS  Always review your discharge instruction sheet given to you by the facility where your surgery was performed.  IF YOU HAVE DISABILITY OR FAMILY LEAVE FORMS, YOU MUST BRING THEM TO THE OFFICE FOR PROCESSING.  DO NOT GIVE THEM TO YOUR DOCTOR.  Take 2 tylenol (acetominophen) three times a day for 3 days.  If you still have pain, add ibuprofen with food in between if able to take this (if you have kidney issues or stomach issues, do not take ibuprofen).  If both of those are not enough, add the narcotic pain pill.  If you find you are needing a lot of this overnight after surgery, call the next morning for a refill.     May take Tylenol/ Ibuprofen today after 1:05 PM    Prescriptions will not be filled after 5pm or on week-ends. Take your usually prescribed medications unless otherwise directed You should eat very light the first 24 hours after surgery, such as soup, crackers, pudding, etc.  Resume your normal diet the day after surgery. Most patients will experience some swelling and bruising in the breast.  Ice packs and a good support bra will help.  Swelling and bruising can take several days to resolve.  It is common to experience some constipation if taking pain medication after surgery.  Increasing fluid intake and taking a stool softener will usually help or prevent this problem from occurring.  A mild laxative (Milk of Magnesia or Miralax) should be taken according to package directions if there are no bowel movements after 48 hours. Unless discharge instructions indicate otherwise, you may remove your bandages 48 hours after surgery, and you may shower at that time.  You may have steri-strips (small skin tapes) in place directly over the incision.  These strips should be left on the skin at least for for 7-10 days.    ACTIVITIES:  You may resume regular daily activities  (gradually increasing) beginning the next day.  Wearing a good support bra or sports bra (or the breast binder) minimizes pain and swelling.  You may have sexual intercourse when it is comfortable. No heavy lifting for 1-2 weeks (not over around 10 pounds).  You may drive when you no longer are taking prescription pain medication, you can comfortably wear a seatbelt, and you can safely maneuver your car and apply brakes. RETURN TO WORK:  __________3-14 days depending on job. _______________ Bonita Quin should see your doctor in the office for a follow-up appointment approximately two weeks after your surgery.  Your doctor's nurse will typically make your follow-up appointment when she calls you with your pathology report.  Expect your pathology report 3-4 business days after your surgery.  You may call to check if you do not hear from [place] after three days.   WHEN TO CALL YOUR DOCTOR: Fever over 101.0 Nausea and/or vomiting. Extreme swelling or bruising. Continued bleeding from incision. Increased pain, redness, or drainage from the incision.  The clinic staff is available to answer your questions during regular business hours.  Please don't hesitate to call and ask to speak to one of the nurses for clinical concerns.  If you have a medical emergency, go to the nearest emergency room or call 911.  A surgeon from [facility] is always on call at the hospital.  For further questions, please visit [email or web address]     Post Anesthesia Home Care Instructions  Activity: Get plenty  of rest for the remainder of the day. A responsible individual must stay with you for 24 hours following the procedure.  For the next 24 hours, DO NOT: -Drive a car -Advertising copywriter -Drink alcoholic beverages -Take any medication unless instructed by your physician -Make any legal decisions or sign important papers.  Meals: Start with liquid foods such as gelatin or soup. Progress to regular foods  as tolerated. Avoid greasy, spicy, heavy foods. If nausea and/or vomiting occur, drink only clear liquids until the nausea and/or vomiting subsides. Call your physician if vomiting continues.  Special Instructions/Symptoms: Your throat may feel dry or sore from the anesthesia or the breathing tube placed in your throat during surgery. If this causes discomfort, gargle with warm salt water. The discomfort should disappear within 24 hours.  If you had a scopolamine patch placed behind your ear for the management of post- operative nausea and/or vomiting:  1. The medication in the patch is effective for 72 hours, after which it should be removed.  Wrap patch in a tissue and discard in the trash. Wash hands thoroughly with soap and water. 2. You may remove the patch earlier than 72 hours if you experience unpleasant side effects which may include dry mouth, dizziness or visual disturbances. 3. Avoid touching the patch. Wash your hands with soap and water after contact with the patch.

## 2023-03-05 NOTE — Anesthesia Postprocedure Evaluation (Signed)
Anesthesia Post Note  Patient: Joyce Garcia  Procedure(s) Performed: RADIOACTIVE SEED GUIDED EXCISION OF RIGHT BREAST MASS (Right: Breast)     Patient location during evaluation: PACU Anesthesia Type: General Level of consciousness: awake and alert Pain management: pain level controlled Vital Signs Assessment: post-procedure vital signs reviewed and stable Respiratory status: spontaneous breathing, nonlabored ventilation, respiratory function stable and patient connected to nasal cannula oxygen Cardiovascular status: blood pressure returned to baseline and stable Postop Assessment: no apparent nausea or vomiting Anesthetic complications: no   No notable events documented.  Last Vitals:  Vitals:   03/05/23 1030 03/05/23 1058  BP: (!) 149/84 (!) 153/79  Pulse: 69 66  Resp: 14 14  Temp:  (!) 36.2 C  SpO2: 96% 95%    Last Pain:  Vitals:   03/05/23 1058  TempSrc:   PainSc: 0-No pain                 Katriel Cutsforth

## 2023-03-05 NOTE — Interval H&P Note (Signed)
History and Physical Interval Note:  03/05/2023 7:41 AM  Joyce Garcia  has presented today for surgery, with the diagnosis of RIGHT BREAST MASS.  The various methods of treatment have been discussed with the patient and family. After consideration of risks, benefits and other options for treatment, the patient has consented to  Procedure(s): RADIOACTIVE SEED GUIDED EXCISION OF RIGHT BREAST MASS (Right) as a surgical intervention.  The patient's history has been reviewed, patient examined, no change in status, stable for surgery.  I have reviewed the patient's chart and labs.  Questions were answered to the patient's satisfaction.     Almond Lint

## 2023-03-05 NOTE — Anesthesia Procedure Notes (Signed)
Procedure Name: LMA Insertion Date/Time: 03/05/2023 7:57 AM  Performed by: Ronnette Hila, CRNAPre-anesthesia Checklist: Patient identified, Emergency Drugs available, Suction available and Patient being monitored Patient Re-evaluated:Patient Re-evaluated prior to induction Oxygen Delivery Method: Circle system utilized Preoxygenation: Pre-oxygenation with 100% oxygen Induction Type: IV induction Ventilation: Mask ventilation without difficulty LMA: LMA inserted LMA Size: 3.0 Number of attempts: 1 Airway Equipment and Method: Bite block Placement Confirmation: positive ETCO2 Tube secured with: Tape Dental Injury: Teeth and Oropharynx as per pre-operative assessment

## 2023-03-05 NOTE — Transfer of Care (Signed)
Immediate Anesthesia Transfer of Care Note  Patient: Akina S Bissette  Procedure(s) Performed: RADIOACTIVE SEED GUIDED EXCISION OF RIGHT BREAST MASS (Right: Breast)  Patient Location: PACU  Anesthesia Type:General  Level of Consciousness: sedated  Airway & Oxygen Therapy: Patient Spontanous Breathing and Patient connected to face mask oxygen  Post-op Assessment: Report given to RN and Post -op Vital signs reviewed and stable  Post vital signs: Reviewed and stable  Last Vitals:  Vitals Value Taken Time  BP 160/89 03/05/23 0936  Temp 36.6 C 03/05/23 0936  Pulse 82 03/05/23 0937  Resp 14 03/05/23 0937  SpO2 100 % 03/05/23 0937  Vitals shown include unvalidated device data.  Last Pain:  Vitals:   03/05/23 0656  TempSrc: Oral  PainSc: 0-No pain      Patients Stated Pain Goal: 3 (03/05/23 0656)  Complications: No notable events documented.

## 2023-03-05 NOTE — Op Note (Signed)
Right Breast Radioactive seed localized excisional biopsy  Indications: This patient presents with history of abnormal right mammogram with discordant core needle biopsy showing sclerosed papillary lesion   Pre-operative Diagnosis: abnormal right mammogram    Post-operative Diagnosis: abnormal right mammogram  Surgeon: Almond Lint   Anesthesia: General endotracheal anesthesia  ASA Class: 2  Procedure Details  The patient was seen in the Holding Room. The risks, benefits, complications, treatment options, and expected outcomes were discussed with the patient. The possibilities of bleeding, infection, the need for additional procedures, failure to diagnose a condition, and creating a complication requiring transfusion or operation were discussed with the patient. The patient concurred with the proposed plan, giving informed consent.  The site of surgery properly noted/marked. The patient was taken to Operating Room # 8, identified, and the procedure verified as right Breast seed localized excisional biopsy. A Time Out was held and the above information confirmed.  The right breast and chest were prepped and draped in standard fashion. A lateral inframammary incision was made near the previously placed radioactive seed.  Dissection was carried down around the point of maximum signal intensity. The cautery was used to perform the dissection.   The specimen was inked with the margin marker paint kit.    Specimen radiography confirmed inclusion of the seed, but not the clip.  Additional margins were taken in all directions.  I discussed with the radiologist and the clip was anterior to the seed, but it wasn't clear which direction anteriorly.  All tissue anterior to the seed was removed.  The background signal in the breast was zero.   Hemostasis was achieved with cautery.  The wound was irrigated and reinspected for hemostasis.    The deep tissues were reapproximated with 2-0 vicryl.  The skin was  then closed with 3-0 vicryl interrupted deep dermal sutures and 4-0 monocryl running subcuticular suture.      Sterile dressings were applied. At the end of the operation, all sponge, instrument, and needle counts were correct.  Findings: Seed in specimen.  Additional margins taken without the clip  Estimated Blood Loss:  min         Specimens: right breast tissue with seed, additional posterior, medial, superior, lateral, inferior, and anterior margins.           Complications:  Clip not visualized in specimen; patient tolerated the procedure well.         Disposition: PACU - hemodynamically stable.         Condition: stable

## 2023-03-06 ENCOUNTER — Encounter (HOSPITAL_BASED_OUTPATIENT_CLINIC_OR_DEPARTMENT_OTHER): Payer: Self-pay | Admitting: General Surgery

## 2023-03-11 ENCOUNTER — Ambulatory Visit: Payer: Self-pay

## 2023-03-11 LAB — SURGICAL PATHOLOGY

## 2023-03-11 NOTE — Telephone Encounter (Signed)
  Chief Complaint: neck pain Symptoms: neck pain, pain during day 6-7/10, night 10/10 Frequency: Thursday  Pertinent Negatives: NA Disposition: [] ED /[] Urgent Care (no appt availability in office) / [] Appointment(In office/virtual)/ []  [facility]/ [] Home Care/ [] Refused Recommended Disposition /[x] [facility]/ []  Follow-up with PCP Additional Notes: pt calling d/t having neck pain since she had surgery on 03/05/23 and pain is getting worse. No appts until 04/03/23. Recommended [facility]. Pt ble to go today. Location details given.  Reason for Disposition  [1] SEVERE neck pain (e.g., excruciating, unable to do any normal activities) AND [2] not improved after 2 hours of pain medicine  Answer Assessment - Initial Assessment Questions 1. ONSET: "When did the pain begin?"      Thursday  2. LOCATION: "Where does it hurt?"      Neck  3. PATTERN "Does the pain come and go, or has it been constant since it started?"      Constant  4. SEVERITY: "How bad is the pain?"  (Scale 1-10; or mild, moderate, severe)   - NO PAIN (0): no pain or only slight stiffness    - MILD (1-3): doesn't interfere with normal activities    - MODERATE (4-7): interferes with normal activities or awakens from sleep    - SEVERE (8-10):  excruciating pain, unable to do any normal activities      6-7 during day, night 10/10 7. CAUSE: "What do you think is causing the neck pain?"     Since after surgery  9. OTHER SYMPTOMS: "Do you have any other symptoms?" (e.g., headache, fever, chest pain, difficulty breathing, neck swelling)  Protocols used: Neck Pain or Stiffness-A-AH

## 2023-03-11 NOTE — Telephone Encounter (Signed)
Noted  

## 2023-03-14 ENCOUNTER — Other Ambulatory Visit: Payer: Self-pay | Admitting: Internal Medicine

## 2023-03-14 DIAGNOSIS — D241 Benign neoplasm of right breast: Secondary | ICD-10-CM

## 2023-03-27 ENCOUNTER — Inpatient Hospital Stay: Payer: No Typology Code available for payment source | Attending: Hematology & Oncology

## 2023-03-27 ENCOUNTER — Encounter: Payer: Self-pay | Admitting: Family

## 2023-03-27 ENCOUNTER — Inpatient Hospital Stay (HOSPITAL_BASED_OUTPATIENT_CLINIC_OR_DEPARTMENT_OTHER): Payer: No Typology Code available for payment source | Admitting: Family

## 2023-03-27 ENCOUNTER — Other Ambulatory Visit: Payer: Self-pay

## 2023-03-27 VITALS — BP 137/86 | HR 65 | Temp 98.4°F | Resp 17 | Ht 63.0 in | Wt 137.0 lb

## 2023-03-27 DIAGNOSIS — D241 Benign neoplasm of right breast: Secondary | ICD-10-CM

## 2023-03-27 DIAGNOSIS — N62 Hypertrophy of breast: Secondary | ICD-10-CM

## 2023-03-27 DIAGNOSIS — N6310 Unspecified lump in the right breast, unspecified quadrant: Secondary | ICD-10-CM

## 2023-03-27 MED ORDER — HYDROXYUREA 500 MG PO CAPS
500.0000 mg | ORAL_CAPSULE | Freq: Every day | ORAL | 5 refills | Status: DC
  Filled 2023-03-27: qty 30, 30d supply, fill #0

## 2023-03-27 NOTE — Progress Notes (Signed)
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 There was concern that if left untreated this could potentially turn into a breast cancer down the road.  She had the breast biopsy with Dr. Marilynne Halsted and follows up again with her office in 3 weeks. The plan is to continue to monitor the area and proceed with lumpectomy if deemed necessary by surgery, per patient.  We discussed starting her on Raloxifene as a preventative precaution. We discussed benefits and potential side effects and she is agreeable to starting. Dr. Myna Hidalgo will enter the order later today.  She states that she also had a lumpectomy of the left breast back in 1996. She states that pathology was negative. 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   Her sister had both uterine and kidney cancer and passed in March 2024.  She still has a regular cycle with heavy flow.  She moved to the [place] 30 years ago with her sister and 2 brothers.  She is now married and has 4 children with history of 3 miscarriages.  No personal history of thrombotic event.  No history of diabetes or thyroid disease.  No fever, chills, n/v, cough, rash, dizziness, SOB, chest pain, palpitations, abdominal pain or changes in bowel or bladder habits.  No swelling, tenderness, numbness or tingling in her extremities.  No falls or syncope.  Appetite has been down due to stress  with the loss of her sister and her recent breast biopsy.  Hydration is good. Weight is 137 lbs.  No smoking, ETOH or recreational drug use.  She stays at home with her sweet family.   ROS: All other 10 point review of systems is negative.   PAST MEDICAL HISTORY:   Past Medical History:  Diagnosis Date   Abnormal Pap smear    at [facility] prior to becoming pregnant, unsure of what was abnormal   Arthritis    GERD (gastroesophageal reflux disease)    History of breast surgery    removed a lump   Hypertension     ALLERGIES: Allergies  Allergen Reactions   Pork-Derived Products     No pork due to religious reasons       MEDICATIONS:  Current Outpatient Medications on File Prior to Visit  Medication Sig Dispense Refill   amLODipine (NORVASC) 2.5 MG tablet Take 1 tablet (2.5 mg total) by mouth daily. 90 tablet 1   atorvastatin (LIPITOR) 20 MG tablet Take 1 tablet (20 mg total) by mouth daily. 90 tablet 1   ibuprofen (ADVIL) 600 MG tablet Take 1 tablet (600 mg total) by mouth every 6 (six) hours as needed. 30 tablet 1   oxyCODONE (OXY IR/ROXICODONE) 5 MG immediate release tablet Take 1 tablet (5 mg total) by mouth every 6 (six) hours as needed for severe pain. 8 tablet 0   No current facility-administered medications on file prior to visit.     PAST SURGICAL HISTORY Past  Surgical History:  Procedure Laterality Date   BREAST BIOPSY Right 11/14/2022   [place] RT BREAST BX W LOC DEV 1ST LESION IMG BX SPEC US GUIDE 11/14/2022 [facility]   BREAST BIOPSY  03/04/2023   [place] RT RADIOACTIVE SEED LOC 03/04/2023 [facility]   BREAST EXCISIONAL BIOPSY Left 1996   BREAST SURGERY     had lump removed   DILATION AND CURETTAGE OF UTERUS     miscarriage   RADIOACTIVE SEED GUIDED EXCISIONAL BREAST BIOPSY Right 03/05/2023   Procedure: RADIOACTIVE SEED GUIDED EXCISION OF RIGHT BREAST MASS;  Surgeon: Almond Lint, MD;  Location: [facility];  Service: General;  Laterality: Right;     FAMILY HISTORY: Family History  Problem Relation Age of Onset   Hypertension Father    Hypertension Sister    Hypertension Sister    Hypertension Sister     SOCIAL HISTORY:  reports that she has never smoked. She has never used smokeless tobacco. She reports that she does not drink alcohol and does not use drugs.  PERFORMANCE STATUS: The patient's performance status is 1 - Symptomatic but completely ambulatory  PHYSICAL EXAM: Most Recent Vital Signs: Blood pressure (!) 164/93, pulse 65, temperature 98.4 F (36.9 C), temperature source Oral, resp. rate 17, height 5\' 3"  (1.6 m), weight 137 lb 0.6 oz (62.2 kg), last menstrual period 02/09/2023, SpO2 100 %. BP (!) 164/93 (BP Location: Left Arm, Patient Position: Sitting)   Pulse 65   Temp 98.4 F (36.9 C) (Oral)   Resp 17   Ht 5\' 3"  (1.6 m)   Wt 137 lb 0.6 oz (62.2 kg)   LMP 02/09/2023 (Exact Date)   SpO2 100%   BMI 24.28 kg/m   General Appearance:    Alert, cooperative, no distress, appears stated age  Head:    Normocephalic, without obvious abnormality, atraumatic  Eyes:    PERRL, conjunctiva/corneas clear, EOM's intact, fundi    benign, both eyes        Throat:   Lips, mucosa, and tongue normal; teeth and gums normal  Neck:   Supple, symmetrical, trachea midline, no adenopathy;    thyroid:  no enlargement/tenderness/nodules; no carotid   bruit or JVD  Back:     Symmetric, no curvature, ROM normal, no CVA tenderness  Lungs:     Clear to auscultation bilaterally, respirations unlabored  Chest Wall:    No tenderness or deformity   Heart:    Regular rate and rhythm, S1 and S2 normal, no murmur, rub   or gallop     Abdomen:     Soft, non-tender, bowel sounds active all four quadrants,    no masses, no organomegaly        Extremities:   Extremities normal, atraumatic, no cyanosis or edema  Pulses:   2+ and symmetric all extremities  Skin:   Skin color, texture, turgor normal, no rashes or lesions  Lymph nodes:    Cervical, supraclavicular, and axillary nodes normal  Neurologic:   CNII-XII intact, normal strength, sensation and reflexes    throughout    LABORATORY DATA:  No results found for this or any previous visit (from the past 48 hour(s)).    RADIOGRAPHY: No results found.     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 There was concern that if left untreated  this could potentially turn into a breast cancer down the road.  She continues to follow along closely with surgeon Dr. Donell Beers.  Discussed at length with Dr. Myna Hidalgo and we will get her onto preventative Evista 60 mg PO daily.  Follow-up in 6 weeks with MD.   All questions were answered. The patient knows to call the clinic with any problems, questions or concerns. We can certainly see the patient much sooner if necessary.  The patient was discussed with Dr. Myna Hidalgo and he is in agreement with the aforementioned.   Eileen Stanford, NP

## 2023-03-29 ENCOUNTER — Other Ambulatory Visit: Payer: Self-pay

## 2023-03-29 ENCOUNTER — Other Ambulatory Visit: Payer: Self-pay | Admitting: Hematology & Oncology

## 2023-03-30 ENCOUNTER — Other Ambulatory Visit: Payer: Self-pay | Admitting: Hematology & Oncology

## 2023-03-30 MED ORDER — RALOXIFENE HCL 60 MG PO TABS: 60.0000 mg | ORAL_TABLET | Freq: Every day | ORAL | 12 refills | Status: DC

## 2023-04-03 ENCOUNTER — Other Ambulatory Visit: Payer: Self-pay

## 2023-04-16 ENCOUNTER — Telehealth: Payer: Self-pay

## 2023-04-16 ENCOUNTER — Encounter: Payer: Self-pay | Admitting: *Deleted

## 2023-04-16 ENCOUNTER — Other Ambulatory Visit: Payer: Self-pay | Admitting: General Surgery

## 2023-04-16 DIAGNOSIS — Z1231 Encounter for screening mammogram for malignant neoplasm of breast: Secondary | ICD-10-CM

## 2023-04-16 DIAGNOSIS — N871 Moderate cervical dysplasia: Secondary | ICD-10-CM

## 2023-04-16 NOTE — Telephone Encounter (Signed)
-----   Message from Eileen Stanford sent at 04/16/2023  8:59 AM EDT ----- Please call and let her know her Evista has been at her pharmacy since 03/30/2023. Thank you! ----- Message ----- From: Almond Lint, MD Sent: 04/15/2023   3:59 PM EDT To: Josph Macho, MD; Erenest Blank, NP  This lady said you guys put her on a medicine, but she got a call from pharmacy that it was canceled so she hasn't started it.  Can you guys let her know if she should be on it? Tx FB

## 2023-04-22 ENCOUNTER — Other Ambulatory Visit: Payer: Self-pay

## 2023-04-22 MED ORDER — RALOXIFENE HCL 60 MG PO TABS
60.0000 mg | ORAL_TABLET | Freq: Every day | ORAL | 12 refills | Status: AC
Start: 2023-04-22 — End: ?
  Filled 2023-04-22: qty 30, 30d supply, fill #0

## 2023-04-22 NOTE — Telephone Encounter (Signed)
Spoke with pt who states that the [facility] said she could get the prescription for $10/month through them. Pt requested the prescription to be sent there. ERX sent.

## 2023-04-23 NOTE — Patient Instructions (Signed)
Taught Joyce Garcia about self breast awareness and gave educational materials to take home. Patient did not need a Pap smear today due to last Pap smear was in 2021 per patient.  Let her know [facility] will cover Pap smears every 5 years unless has a history of abnormal Pap smears. Referred patient to the [name] for diagnostic mammogram. Appointment scheduled for 11/01/22. Patient aware of appointment and will be there. Let patient know will follow up with her within the next couple weeks with results. Joyce Garcia verbalized understanding.  Melodye Ped, NP 11/01/22 1:40 PM

## 2023-04-26 ENCOUNTER — Other Ambulatory Visit: Payer: Self-pay

## 2023-05-06 ENCOUNTER — Inpatient Hospital Stay: Payer: No Typology Code available for payment source | Admitting: Hematology & Oncology

## 2023-05-06 ENCOUNTER — Inpatient Hospital Stay: Payer: No Typology Code available for payment source

## 2023-05-15 ENCOUNTER — Ambulatory Visit
Admission: RE | Admit: 2023-05-15 | Discharge: 2023-05-15 | Disposition: A | Discharge status: Home / Self Care | Payer: No Typology Code available for payment source | Source: Ambulatory Visit | Attending: General Surgery | Admitting: General Surgery

## 2023-05-15 ENCOUNTER — Other Ambulatory Visit: Payer: Self-pay | Admitting: General Surgery

## 2023-05-15 DIAGNOSIS — D369 Benign neoplasm, unspecified site: Secondary | ICD-10-CM

## 2023-05-21 ENCOUNTER — Encounter: Payer: Self-pay | Admitting: Hematology & Oncology

## 2023-05-21 ENCOUNTER — Inpatient Hospital Stay: Payer: No Typology Code available for payment source | Attending: Hematology & Oncology

## 2023-05-21 ENCOUNTER — Inpatient Hospital Stay (HOSPITAL_BASED_OUTPATIENT_CLINIC_OR_DEPARTMENT_OTHER): Payer: No Typology Code available for payment source | Admitting: Family

## 2023-05-21 VITALS — BP 147/80 | HR 72 | Temp 99.2°F | Resp 20 | Ht 63.0 in | Wt 140.8 lb

## 2023-05-21 DIAGNOSIS — D241 Benign neoplasm of right breast: Secondary | ICD-10-CM | POA: Insufficient documentation

## 2023-05-21 DIAGNOSIS — N6310 Unspecified lump in the right breast, unspecified quadrant: Secondary | ICD-10-CM

## 2023-05-21 LAB — CMP (CANCER CENTER ONLY)
ALT: 12 U/L (ref 0–44)
AST: 12 U/L — ABNORMAL LOW (ref 15–41)
Albumin: 4.1 g/dL (ref 3.5–5.0)
Alkaline Phosphatase: 47 U/L (ref 38–126)
Anion gap: 5 (ref 5–15)
BUN: 20 mg/dL (ref 6–20)
CO2: 30 mmol/L (ref 22–32)
Calcium: 9.2 mg/dL (ref 8.9–10.3)
Chloride: 107 mmol/L (ref 98–111)
Creatinine: 0.96 mg/dL (ref 0.44–1.00)
GFR, Estimated: >60 mL/min (ref >60)
Glucose, Bld: 98 mg/dL (ref 70–99)
Potassium: 4.3 mmol/L (ref 3.5–5.1)
Sodium: 142 mmol/L (ref 135–145)
Total Bilirubin: 0.4 mg/dL (ref 0.3–1.2)
Total Protein: 7.3 g/dL (ref 6.5–8.1)

## 2023-05-21 LAB — CBC WITH DIFFERENTIAL (CANCER CENTER ONLY)
Abs Immature Granulocytes: 0.02 10*3/uL (ref 0.00–0.07)
Basophils Absolute: 0 10*3/uL (ref 0.0–0.1)
Basophils Relative: 1 %
Eosinophils Absolute: 0.2 10*3/uL (ref 0.0–0.5)
Eosinophils Relative: 2 %
HCT: 36.2 % (ref 36.0–46.0)
Hemoglobin: 11.7 g/dL — ABNORMAL LOW (ref 12.0–15.0)
Immature Granulocytes: 0 %
Lymphocytes Relative: 40 %
Lymphs Abs: 3 10*3/uL (ref 0.7–4.0)
MCH: 30.6 pg (ref 26.0–34.0)
MCHC: 32.3 g/dL (ref 30.0–36.0)
MCV: 94.8 fL (ref 80.0–100.0)
Monocytes Absolute: 0.7 10*3/uL (ref 0.1–1.0)
Monocytes Relative: 9 %
Neutro Abs: 3.5 10*3/uL (ref 1.7–7.7)
Neutrophils Relative %: 48 %
Platelet Count: 292 10*3/uL (ref 150–400)
RBC: 3.82 MIL/uL — ABNORMAL LOW (ref 3.87–5.11)
RDW: 15.2 % (ref 11.5–15.5)
WBC Count: 7.4 10*3/uL (ref 4.0–10.5)
nRBC: 0 % (ref 0.0–0.2)

## 2023-05-21 LAB — LACTATE DEHYDROGENASE: LDH: 184 U/L (ref 98–192)

## 2023-05-21 NOTE — Progress Notes (Signed)
Hematology and Oncology Follow Up Visit  [name] [id number] 1975/12/23 47 y.o. 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 is here today for follow-up. She is doing well and has no complaints at this time.  She still has not started taking the Evista. She had some questions about side effects which we went over. She has picked up the medication from the pharmacy and states that she will give it a try.  No changes on breast exam per patient.  No lymphedema.  No fever, chills, n/v, cough, rash, dizziness, SOB, chest pain, palpitations, abdominal pain or changes in bowel or bladder habits.  No blood loss. No bruising or petechiae.  No swelling, tenderness, numbness or tingling in her extremities.  No falls or syncope.  Appetite and hydration are good. Weight is stable at 140 lbs.   ECOG Performance Status: 0 - Asymptomatic  Medications:  Allergies as of 05/21/2023       Reactions   Pork-derived Products    No pork due to religious reasons        Medication List        Accurate as of May 21, 2023 10:20 AM. If you have any questions, ask your nurse or doctor.          amLODipine 2.5 MG tablet Commonly known as: NORVASC Take 1 tablet (2.5 mg total) by mouth daily.   atorvastatin 20 MG tablet Commonly known as: LIPITOR Take 1 tablet (20 mg total) by mouth daily.   ibuprofen 600 MG tablet Commonly known as: ADVIL Take 1 tablet (600 mg total) by mouth every 6 (six) hours as needed.   oxyCODONE 5 MG immediate release tablet Commonly known as: Oxy IR/ROXICODONE Take 1 tablet (5 mg total) by mouth every 6 (six) hours as needed for severe pain.   raloxifene 60 MG tablet Commonly known as: EVISTA Take 1 tablet (60 mg total) by mouth daily.        Allergies:  Allergies  Allergen Reactions    Pork-Derived Products     No pork due to religious reasons     Past Medical History, Surgical history, Social history, and Family History were reviewed and updated.  Review of Systems: All other 10 point review of systems is negative.   Physical Exam:  height is 5\' 3"  (1.6 m) and weight is 140 lb 12.8 oz (63.9 kg). Her oral temperature is 99.2 F (37.3 C). Her blood pressure is 172/79 (abnormal) and her pulse is 72. Her respiration is 20 and oxygen saturation is 100%.   Wt Readings from Last 3 Encounters:  05/21/23 140 lb 12.8 oz (63.9 kg)  03/27/23 137 lb 0.6 oz (62.2 kg)  03/05/23 139 lb 1.8 oz (63.1 kg)    Ocular: Sclerae unicteric, pupils equal, round and reactive to light Ear-nose-throat: Oropharynx clear, dentition fair Lymphatic: No cervical or supraclavicular adenopathy Lungs no rales or rhonchi, good excursion bilaterally Heart regular rate and rhythm, no murmur appreciated Abd soft, nontender, positive bowel sounds MSK no focal spinal tenderness, no joint edema Neuro: non-focal, well-oriented, appropriate affect Breasts: Deferred this visit.   Lab Results  Component Value Date   WBC 7.4 05/21/2023   HGB 11.7 (L) 05/21/2023   HCT 36.2 05/21/2023   MCV 94.8 05/21/2023   PLT 292 05/21/2023   No results found for: "FERRITIN", "IRON", "TIBC", "  UIBC", "IRONPCTSAT" Lab Results  Component Value Date   RBC 3.82 (L) 05/21/2023   No results found for: "KPAFRELGTCHN", "LAMBDASER", "KAPLAMBRATIO" No results found for: "IGGSERUM", "IGA", "IGMSERUM" No results found for: "TOTALPROTELP", "ALBUMINELP", "A1GS", "A2GS", "BETS", "BETA2SER", "GAMS", "MSPIKE", "SPEI"   Chemistry      Component Value Date/Time   NA 143 11/28/2022 1019   K 3.7 11/28/2022 1019   CL 103 11/28/2022 1019   CO2 22 11/28/2022 1019   BUN 13 11/28/2022 1019   CREATININE 1.01 (H) 11/28/2022 1019   CREATININE 0.78 06/01/2013 1216      Component Value Date/Time   CALCIUM 9.6 11/28/2022 1019    ALKPHOS 59 11/28/2022 1019   AST 16 11/28/2022 1019   ALT 13 11/28/2022 1019   BILITOT 0.6 11/28/2022 1019       Impression and Plan: [name] is a pleasant African American (originally from [place]) 47 yo female with recent biopsy of mass in right breast which showed a focal intraductal papilloma with ductal hyperplasia and apocrine metaplasia. There was concern that if left untreated this could potentially turn into a breast cancer down the road.  She continues to follow along closely with surgeon Dr. Donell Beers.  She verbalized that she will try taking the Evista 60 mg PO daily.  Follow-up in 2 months.   Eileen Stanford, NP 9/3/202410:20 AM

## 2023-05-21 NOTE — Progress Notes (Signed)
BP elevated, does not take Norvasc. Uses tea from [place]. Encouraged to monitor BP at home. Verbalized understanding.

## 2023-05-21 NOTE — Progress Notes (Signed)
 PROVIDER:  JINA CLAIR NEPHEW, MD Patient Care Team: Seena Clam, MD as PCP - General (Family Medicine) NEPHEW JINA CLAIR, MD as Consulting Provider (Surgical Oncology)  MRN: [id number] DOB: 01-19-1976 DATE OF ENCOUNTER: 05/21/2023 Initial History:    Patient presents May 2024 with a right breast mass for approximately 2 months.  She subsequently underwent diagnostic breast imaging.  This demonstrated a 1.2 cm mass in the right breast 930 with 8 cm from the nipple.  There is also a cluster of cysts in the right breast between 6 and 9:00.  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  She was recommended for excision and was referred to surgery.   Of note her sister had a history of uterine cancer and kidney cancer.  They are from [place] in [place] but they have been here for a while and her sister was treated in the [place] .  Her sister has passed away.    Interval History:     Pt underwent seed localized excisional biopsy 03/11/2023.  The seed and clip could not be visualized on the same mammogram.  The seed was retrieved, but no clip was seen.  Additional margins were taken in the cardinal directions, but the clip was still not found.  The path showed sclerosing adenosis and papilloma, but the biopsy site was not definitively seen.    Pt underwent repeat dx imaging and clip was not seen.  Her incision was well healed.    Patient started on Evista .        Dx mammogram/us  [facility] 05/15/2023  ACR Breast Density Category c: The breasts are heterogeneously dense, which may obscure small masses.   FINDINGS: Postoperative changes are seen far posteriorly in the lateral aspect of the right breast. Biopsy marker clip is not visualized on the mammographic images.   Targeted ultrasound is performed, showing postoperative changes in the lateral aspect of the right breast. The previously seen circumscribed mass at 9:30 8 cm from  the nipple is not visualized.   IMPRESSION: Postoperative changes in the 930 region of the right breast. The heart shaped clip is not seen mammographically or sonographically.   RECOMMENDATION: Bilateral diagnostic mammogram and possible ultrasound in February of 2025 is recommended to document stability of the right breast for 1 year.   I have discussed the findings and recommendations with the patient. If applicable, a reminder letter will be sent to the patient regarding the next appointment.   BI-RADS CATEGORY  2: Benign.  Pathology 03/11/2023 A. BREAST, RIGHT, LUMPECTOMY: - Benign breast parenchyma with sclerosing adenosis, focal intraductal papilloma(s) (2 mm), usual ductal hyperplasia (UDH), fibrocystic changes including stromal fibrosis, apocrine metaplasia, and columnar cell change, and focal fibroadenomatoid change - Negative for malignancy - See comment  B. BREAST, RIGHT POSTERIOR MARGIN, EXCISION: - Benign fibroadipose tissue and muscle - No breast parenchyma identified - Negative for malignancy  C. BREAST, RIGHT MEDIAL MARGIN, EXCISION: - Benign breast parenchyma with sclerosing adenosis and fibrocystic changes - Negative for malignancy  D. BREAST, RIGHT SUPERIOR MARGIN, EXCISION: - Benign breast parenchyma with sclerosing adenosis, usual ductal hyperplasia (UDH), fibrocystic changes including columnar cell change, apocrine metaplasia, and stromal fibrosis - Negative for malignancy  E. BREAST, RIGHT LATERAL MARGIN, EXCISION: - Benign fibroadipose tissue and muscle - No breast parenchyma identified - Negative for malignancy  F. BREAST, RIGHT INFERIOR MARGIN, EXCISION: - Benign breast parenchyma with usual ductal hyperplasia (UDH), adenosis and fibrocystic changes including stromal fibrosis  and apocrine metaplasia - Negative for malignancy  G. BREAST, RIGHT ANTERIOR MARGIN, EXCISION: - Benign breast parenchyma with usual ductal hyperplasia  (UDH), sclerosing adenosis, and fibrocystic changes - Negative for malignancy   Physical Examination:   Right breast:  wound has healed over.  No palpable masses.    Assessment and Plan:     Diagnoses and all orders for this visit:  Intraductal papilloma of breast, right   Continue evista  for cancer prevention.  Follow up in 6 months with repeat mammogram/us     Return in about 6 months (around 11/18/2023).   The plan was discussed in detail with the patient today, who expressed understanding.  The patient has my contact information, and understands to call me with any additional questions or concerns in the interval.  I would be happy to see the patient back sooner if the need arises.   JINA CLAIR NEPHEW, MD

## 2023-10-02 ENCOUNTER — Other Ambulatory Visit: Payer: Self-pay | Admitting: General Surgery

## 2023-10-02 ENCOUNTER — Encounter: Payer: Self-pay | Admitting: General Surgery

## 2023-10-02 DIAGNOSIS — D241 Benign neoplasm of right breast: Secondary | ICD-10-CM

## 2024-05-07 ENCOUNTER — Other Ambulatory Visit: Payer: Self-pay

## 2024-05-07 DIAGNOSIS — D241 Benign neoplasm of right breast: Secondary | ICD-10-CM

## 2024-05-07 DIAGNOSIS — Z9889 Other specified postprocedural states: Secondary | ICD-10-CM

## 2024-07-08 NOTE — Progress Notes (Deleted)
 Joyce Garcia is a 48 y.o. [phone] female who presents to [facility] clinic today with {Blank single:19197::no complaints,complaint of} ***.    Pap Smear: Pap smear completed today. Last Pap smear was 05/30/2020 at the free cervical cancer screening clinic and was normal with negative HPV.  Per patient has history of an abnormal Pap smear 06/2008 that was ASCUS with positive HPV. Patients previous Pap smear 11/2007 was normal with positive HPV. 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 Last three Pap smear and colposcopy results is available in Epic.    Physical exam: Breasts Breasts symmetrical. No skin abnormalities bilateral breasts. No nipple retraction bilateral breasts. No nipple discharge bilateral breasts. No lymphadenopathy. No lumps palpated bilateral breasts.      MM 3D DIAGNOSTIC MAMMOGRAM UNILATERAL RIGHT BREAST Result Date: 05/15/2023 CLINICAL DATA:  48 year old female who underwent ultrasound-guided core biopsy of a mass in the 9:30 region of the right breast on 11/14/2022 demonstrating a sclerosing papillary lesion with usual ductal hyperplasia. A heart shaped clip was placed and found to be concordant. Patient underwent radioactive seed placement with ultrasound guidance secondary to the far posterior location of the lesion on 03/04/2023. The radioactive seed could not be seen mammographically following seed placement but the heart shaped clip was visualized. Patient underwent excisional biopsy on 03/05/2023. The radioactive seed was present and intact but the biopsy marker clip was not visualized. Lumpectomy specimen demonstrated benign breast parenchyma with sclerosing adenosis, focal intraductal papilloma, usual ductal hyperplasia including stromal fibrosis, apocrine metaplasia and columnar cell change. Negative for malignancy. EXAM: DIGITAL DIAGNOSTIC UNILATERAL RIGHT MAMMOGRAM WITH TOMOSYNTHESIS AND CAD;  ULTRASOUND RIGHT BREAST LIMITED TECHNIQUE: Right digital diagnostic mammography and breast tomosynthesis was performed. The images were evaluated with computer-aided detection. ; Targeted ultrasound examination of the right breast was performed COMPARISON:  Previous exam(s). ACR Breast Density Category c: The breasts are heterogeneously dense, which may obscure small masses. FINDINGS: Postoperative changes are seen far posteriorly in the lateral aspect of the right breast. Biopsy marker clip is not visualized on the mammographic images. Targeted ultrasound is performed, showing postoperative changes in the lateral aspect of the right breast. The previously seen circumscribed mass at 9:30 8 cm from the nipple is not visualized. IMPRESSION: Postoperative changes in the 930 region of the right breast. The heart shaped clip is not seen mammographically or sonographically. RECOMMENDATION: Bilateral diagnostic mammogram and possible ultrasound in February of 2025 is recommended to document stability of the right breast for 1 year. I have discussed the findings and recommendations with the patient. If applicable, a reminder letter will be sent to the patient regarding the next appointment. BI-RADS CATEGORY  2: Benign. Electronically Signed   By: Dina  Arceo M.D.   On: 05/15/2023 16:27   MM Breast Surgical Specimen Result Date: 03/05/2023 CLINICAL DATA:  Post lumpectomy specimen radiograph EXAM: SPECIMEN RADIOGRAPH OF THE RIGHT BREAST COMPARISON:  Previous exam(s). FINDINGS: Status post excision of the right breast. The radioactive seed is present and completely intact. The biopsy marking clip is not visualized. IMPRESSION: Specimen radiograph of the right breast. These results were called by telephone at the time of interpretation on 03/05/2023 at 8:54 am to provider JINA NEPHEW , who verbally acknowledged these results. Electronically Signed   By: Inocente Ast M.D.   On: 03/05/2023 08:55  MM CLIP PLACEMENT  RIGHT Result Date: 03/04/2023 CLINICAL DATA:  Post procedure mammogram for seed placement. EXAM: 3D DIAGNOSTIC RIGHT  MAMMOGRAM POST RADIOACTIVE SEED PLACEMENT COMPARISON:  Previous exam(s). FINDINGS: 3D Mammographic images were obtained following radioactive seed placement of the right breast. The heart shaped biopsy marking clip is partially visualized in the outer right breast but the seed could not be visualized due to deep location despite multiple attempts. The seed was visualized in the mass at 9:30 o'clock under ultrasound guidance. IMPRESSION: The radioactive seed in the right breast could not be visualized mammographically due to far posterior location. The seed was visualized in the mass at 9:30 o'clock under ultrasound guidance. Final Assessment: Post Procedure Mammograms for Marker Placement Electronically Signed   By: Inocente Ast M.D.   On: 03/04/2023 14:26  MM CLIP PLACEMENT RIGHT Result Date: 11/14/2022 CLINICAL DATA:  Status post ultrasound-guided biopsy of a RIGHT breast mass at the 9:30 o'clock axis. EXAM: 3D DIAGNOSTIC RIGHT MAMMOGRAM POST ULTRASOUND BIOPSY COMPARISON:  Previous exam(s). FINDINGS: 3D Mammographic images were obtained following ultrasound guided biopsy of the RIGHT breast mass at the 9:30 o'clock axis. The biopsy marking clip is in expected position at the site of biopsy. IMPRESSION: Appropriate positioning of the heart shaped biopsy marking clip at the site of biopsy in the outer RIGHT breast corresponding to the targeted mass at the 9:30 o'clock axis. Final Assessment: Post Procedure Mammograms for Marker Placement Electronically Signed   By: Lael Hines M.D.   On: 11/14/2022 12:03  MS DIGITAL DIAG TOMO BILAT Result Date: 11/13/2022 CLINICAL DATA:  Patient describes a palpable lump within the outer RIGHT breast. History of bilateral breast cysts. EXAM: DIGITAL DIAGNOSTIC BILATERAL MAMMOGRAM WITH TOMOSYNTHESIS; ULTRASOUND RIGHT BREAST LIMITED; ULTRASOUND LEFT BREAST  LIMITED TECHNIQUE: Bilateral digital diagnostic mammography and breast tomosynthesis was performed.; Targeted ultrasound examination of the right breast was performed; Targeted ultrasound examination of the left breast was performed. COMPARISON:  Previous exam(s). ACR Breast Density Category c: The breasts are heterogeneously dense, which may obscure small masses. FINDINGS: Bilateral diagnostic mammogram: There are no new dominant masses, suspicious calcifications or secondary signs of malignancy identified in either breast. RIGHT breast: Targeted ultrasound is performed, showing an irregular hypoechoic mass in the RIGHT breast at the 9:30 o'clock axis, 8 cm from the nipple, measuring 1.2 x 0.7 x 1.1 cm, without internal vascularity, corresponding to the palpable area of concern. RIGHT axilla was evaluated with ultrasound showing no enlarged or morphologically abnormal lymph nodes. There is a benign cluster of cysts in the RIGHT breast at the 6 o'clock axis which measures 1.6 cm and a benign simple cyst in the RIGHT breast at the 9 o'clock axis which measures 1.2 cm, corresponding as incidental findings. LEFT breast: Targeted ultrasound is performed, showing near complete resolution of a previously demonstrated cyst in the LEFT breast at the 2 o'clock axis. There is an additional benign cyst in the LEFT breast at the 12 o'clock axis. IMPRESSION: 1. Indeterminate hypoechoic mass in the RIGHT breast at the 9:30 o'clock axis, 8 cm from the nipple, measuring 1.2 cm, corresponding to patient's palpable area of concern. Differential considerations include benign complicated cyst and benign fibroadenoma. Ultrasound-guided biopsy is recommended to exclude malignancy. 2. No evidence of malignancy within the LEFT breast. 3. Bilateral breast cysts, as detailed above. RECOMMENDATION: Ultrasound-guided biopsy for the RIGHT breast mass at the 9:30 o'clock axis. Ultrasound-guided biopsy is scheduled on February 28th. I have  discussed the findings and recommendations with the patient. If applicable, a reminder letter will be sent to the patient regarding the next appointment. BI-RADS CATEGORY  4: Suspicious. Electronically  Signed   By: Lael Hines M.D.   On: 11/13/2022 13:34  MS DIGITAL DIAG TOMO BILAT Result Date: 05/12/2020 CLINICAL DATA:  Mass with mild intermittent tenderness in the upper outer left breast anteriorly for the past 2 years. The patient reports no change in size of the mass during that time. Mass felt on recent physical examination in the 6 o'clock position of the right breast. No family history of breast cancer. EXAM: DIGITAL DIAGNOSTIC BILATERAL MAMMOGRAM WITH CAD AND TOMO ULTRASOUND BILATERAL BREAST COMPARISON:  None. ACR Breast Density Category c: The breast tissue is heterogeneously dense, which may obscure small masses. FINDINGS: There is a small oval mass in the 6 o'clock position of the right breast just beneath the skin, at the location of the mass recently felt on physical examination. No abnormalities demonstrated elsewhere in either breast. Specifically, no abnormalities are seen at the location of the area of patient concern on the left, marked with a metallic marker. Mammographic images were processed with CAD. On physical exam, no mass is palpable in the outer retroareolar left breast today. Targeted ultrasound is performed, showing multiple subcentimeter cysts in the outer retroareolar periareolar left breast, containing low-level internal echoes. At real time, these were circumscribed and had fewer internal echoes than seen on some of the initially submitted images. No internal blood flow was demonstrated within any of these with power Doppler. In the 5:30 o'clock position of the right breast, 3 cm from the nipple, a 9 x 8 x 4 mm cyst is demonstrated just beneath the skin. This contains minimal thin internal septations with no internal blood flow with power Doppler. This corresponds to the  mammographic and recently palpated mass. IMPRESSION: 1. No evidence of malignancy. 2. Bilateral benign breast cysts. RECOMMENDATION: Bilateral screening mammogram in 1 year. I have discussed the findings and recommendations with the patient. If applicable, a reminder letter will be sent to the patient regarding the next appointment. BI-RADS CATEGORY  2: Benign. Electronically Signed   By: Elspeth Bathe M.D.   On: 05/12/2020 15:02    Pelvic/Bimanual Ext Genitalia No lesions, no swelling and no discharge observed on external genitalia.        Vagina Vagina pink and normal texture. No lesions or discharge observed in vagina.        Cervix Cervix is present. Cervix pink and of normal texture. No discharge observed.    Uterus Uterus is present and palpable. Uterus in normal position and normal size.        Adnexae Bilateral ovaries present and palpable. No tenderness on palpation.         Rectovaginal No rectal exam completed today since patient had no rectal complaints. No skin abnormalities observed on exam.     Smoking History: Patient has {Blank single:19197::never smoked,is a former smoker,is a current smoker at *** packs per day} ***referred to quit line.    Patient Navigation: Patient education provided. Access to services provided for patient through [place] program. Spanish interpreter Bernice Angry from [facility] provided.   Colorectal Cancer Screening: Per patient {Blank single:19197::has had colonoscopy completed on ***,has never had colonoscopy completed} No complaints today.    Breast and Cervical Cancer Risk Assessment: Patient {Blank single:19197::has,does not have} family history of breast cancer, known genetic mutations, or radiation treatment to the chest before age 35. Patient {Blank single:19197::has,does not have} history of cervical dysplasia, immunocompromised, or DES exposure in-utero.  Risk Scores as of Encounter on 07/09/2024     Alisa as of  11/01/2022           5-year 0.83%   Lifetime 6.92%            Last calculated by Logan Lyle BRAVO, CMA on 11/01/2022 at  1:20 PM        A: [facility] exam with pap smear Complaint of ***  P: [facility] to [facility] for a {Blank single:19197::screening,diagnostic} mammogram. Appointment scheduled ***.  Driscilla Wanda SQUIBB, RN 07/08/2024 10:26 PM

## 2024-07-09 ENCOUNTER — Other Ambulatory Visit: Payer: Self-pay

## 2024-07-09 ENCOUNTER — Ambulatory Visit: Payer: Self-pay
